# Patient Record
Sex: Female | Born: 1945 | Race: Black or African American | Hispanic: No | Marital: Single | Smoking: Former smoker
Health system: Southern US, Community
[De-identification: ages and names within clinical notes are randomized; demographics above are authoritative.]

## PROBLEM LIST (undated history)

## (undated) DIAGNOSIS — M5136 Other intervertebral disc degeneration, lumbar region: Secondary | ICD-10-CM

## (undated) DIAGNOSIS — K5792 Diverticulitis of intestine, part unspecified, without perforation or abscess without bleeding: Secondary | ICD-10-CM

## (undated) DIAGNOSIS — E119 Type 2 diabetes mellitus without complications: Secondary | ICD-10-CM

## (undated) DIAGNOSIS — E785 Hyperlipidemia, unspecified: Secondary | ICD-10-CM

## (undated) DIAGNOSIS — N189 Chronic kidney disease, unspecified: Secondary | ICD-10-CM

## (undated) DIAGNOSIS — I10 Essential (primary) hypertension: Secondary | ICD-10-CM

## (undated) DIAGNOSIS — M51369 Other intervertebral disc degeneration, lumbar region without mention of lumbar back pain or lower extremity pain: Secondary | ICD-10-CM

## (undated) DIAGNOSIS — K219 Gastro-esophageal reflux disease without esophagitis: Secondary | ICD-10-CM

## (undated) DIAGNOSIS — N390 Urinary tract infection, site not specified: Secondary | ICD-10-CM

## (undated) HISTORY — PX: OTHER SURGICAL HISTORY: SHX169

## (undated) HISTORY — PX: DILATION AND CURETTAGE OF UTERUS: SHX78

## (undated) HISTORY — PX: BACK SURGERY: SHX140

## (undated) HISTORY — PX: LUMBAR FUSION: SHX111

## (undated) HISTORY — PX: SPINAL FUSION: SHX223

## (undated) HISTORY — PX: TUBAL LIGATION: SHX77

---

## 1972-08-14 HISTORY — PX: BREAST CYST ASPIRATION: SHX578

## 2007-08-14 ENCOUNTER — Ambulatory Visit: Payer: Self-pay | Admitting: Obstetrics & Gynecology

## 2007-08-20 ENCOUNTER — Ambulatory Visit: Payer: Self-pay | Admitting: Obstetrics & Gynecology

## 2008-03-18 ENCOUNTER — Ambulatory Visit: Payer: Self-pay | Admitting: Internal Medicine

## 2008-04-15 ENCOUNTER — Ambulatory Visit: Payer: Self-pay | Admitting: Internal Medicine

## 2008-04-16 ENCOUNTER — Ambulatory Visit: Payer: Self-pay | Admitting: Internal Medicine

## 2008-12-19 IMAGING — RF DG UGI W/O KUB
1 series · 15 of 15 positions shown · non-contrast
Comparison: none

REASON FOR EXAM: reflux chest back pain
COMMENTS:

[Series 1: run · 15 of 15 slices shown]
[im 1/15]
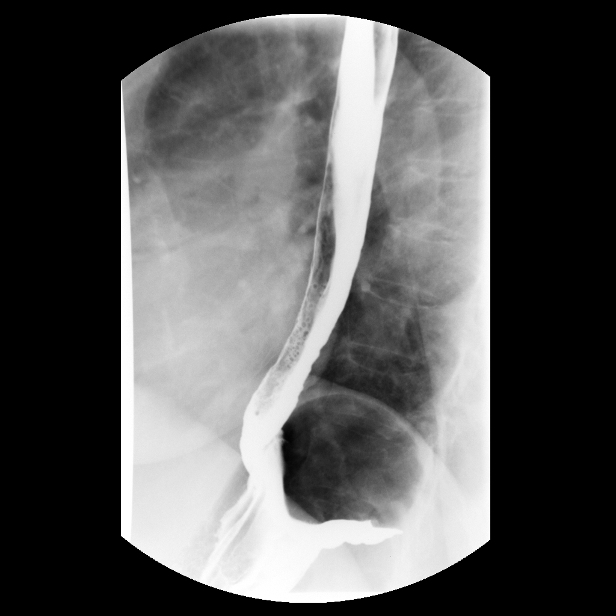
[im 2/15]
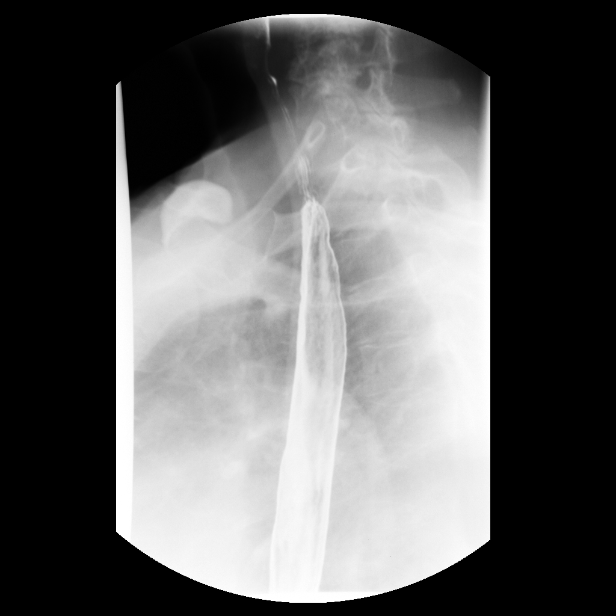
[im 3/15]
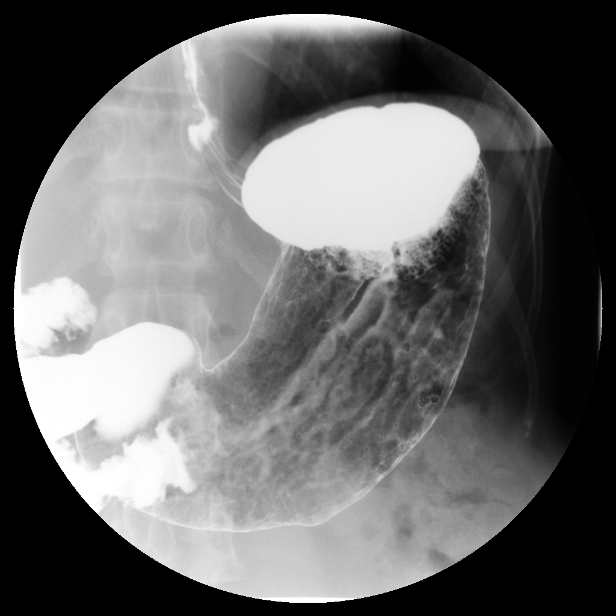
[im 4/15]
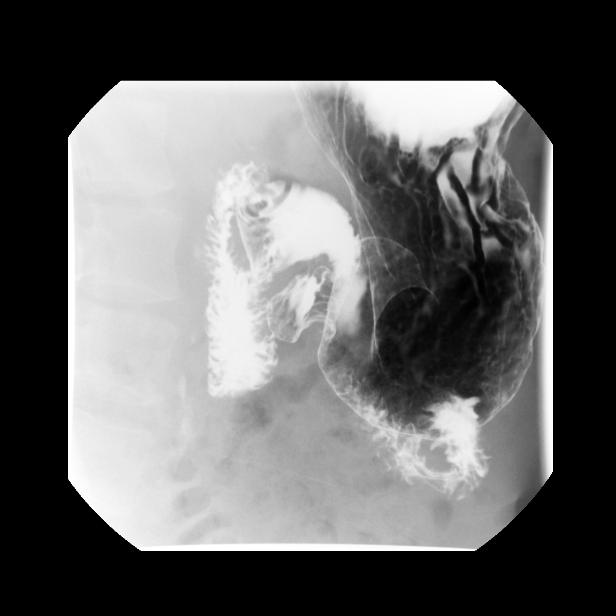
[im 5/15]
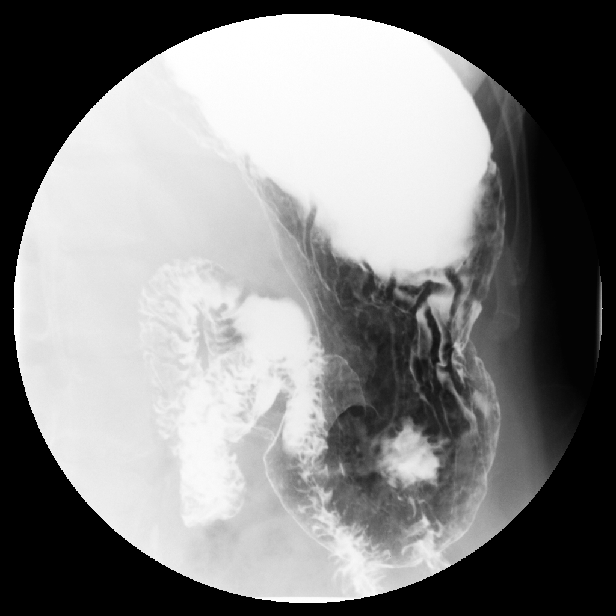
[im 6/15]
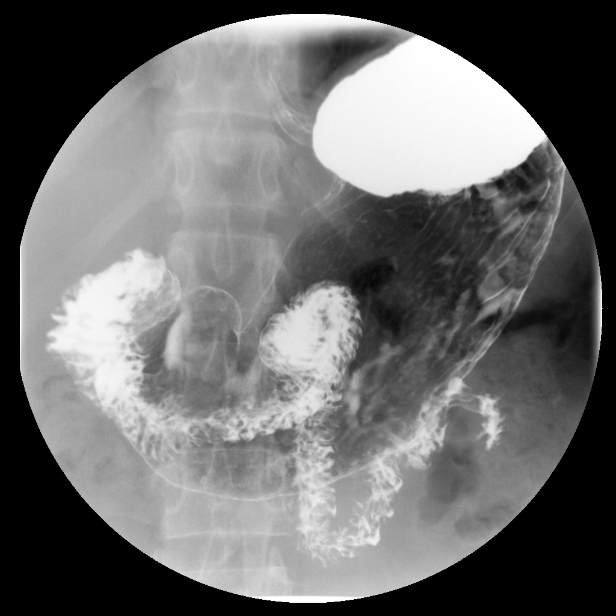
[im 7/15]
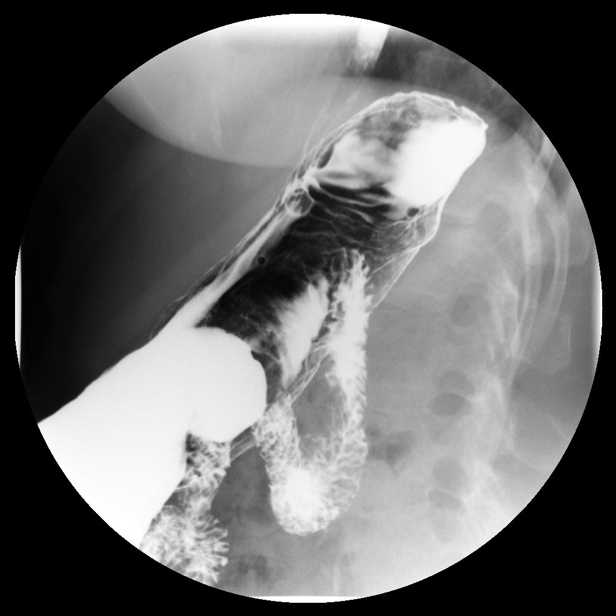
[im 8/15]
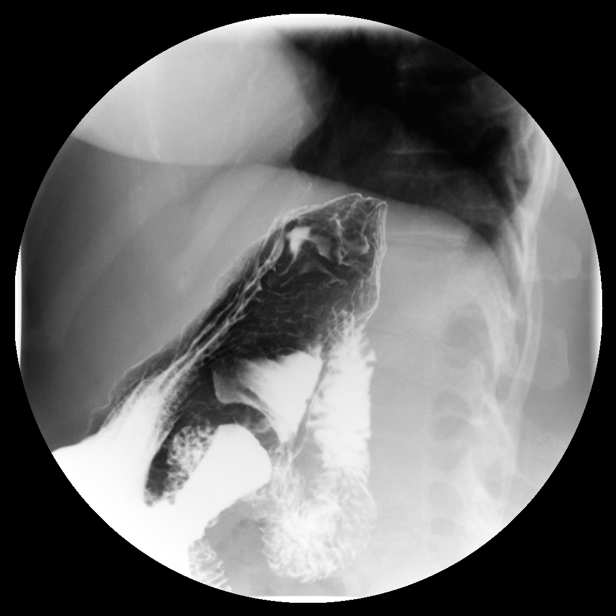
[im 9/15]
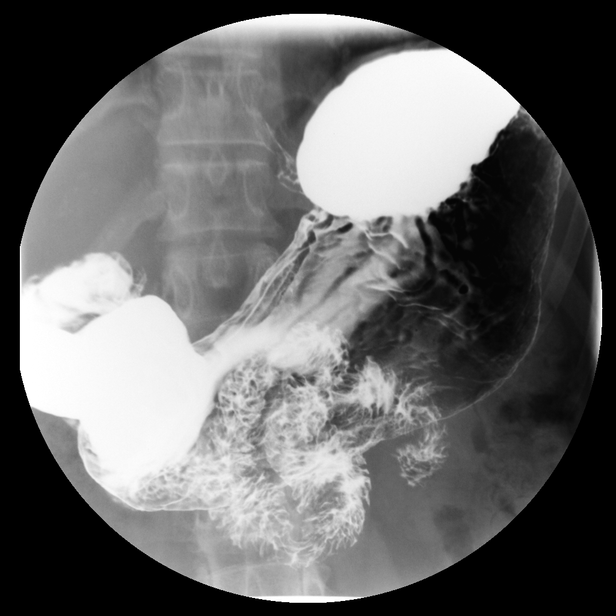
[im 10/15]
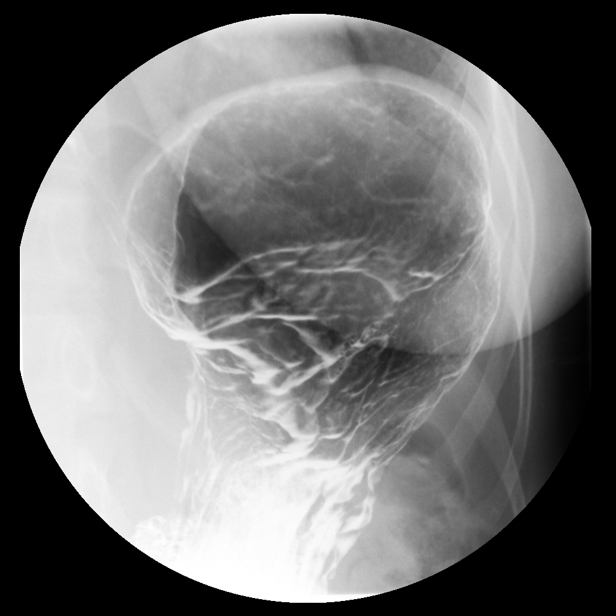
[im 11/15]
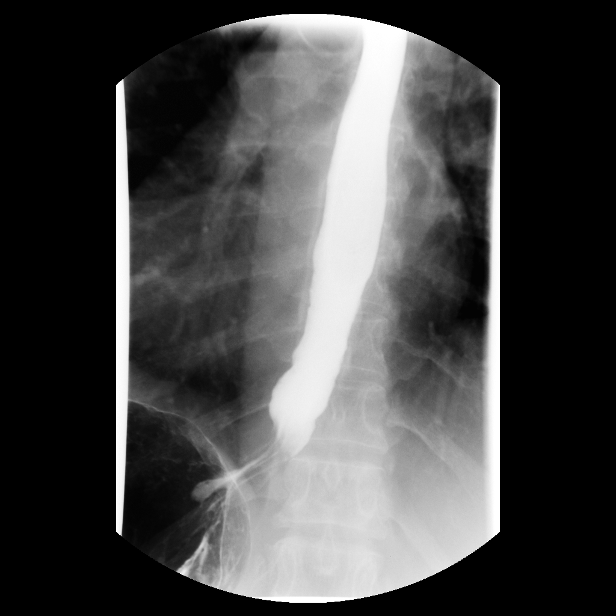
[im 12/15]
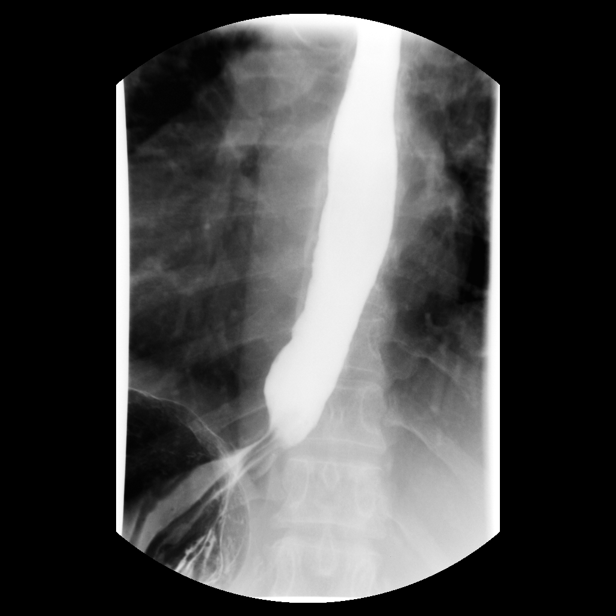
[im 13/15]
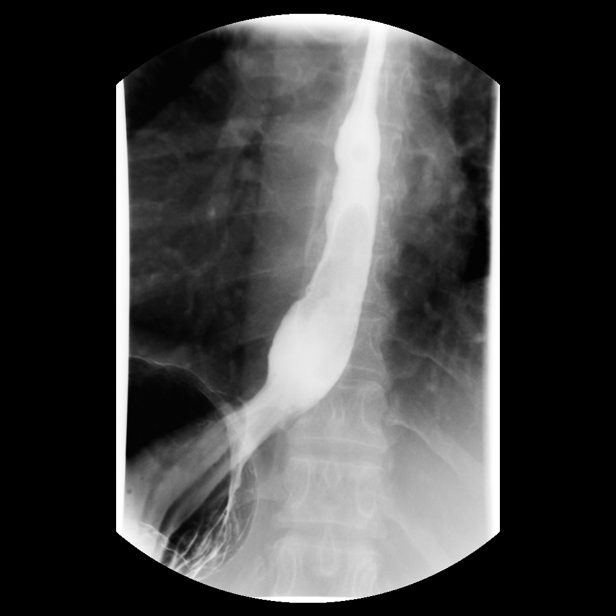
[im 14/15]
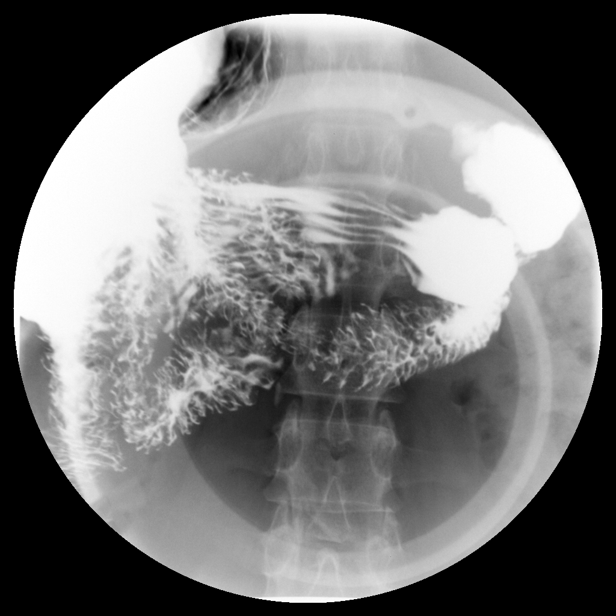
[im 15/15]
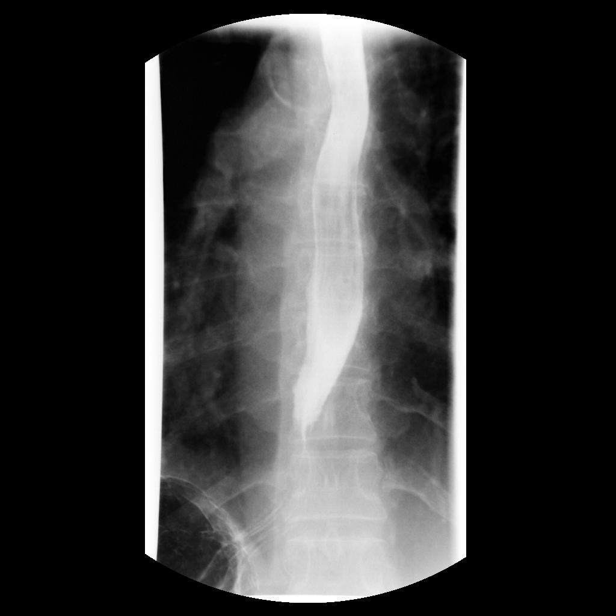

[15 of 15 positions shown; findings below may reference images not displayed]

PROCEDURE:     FL  - FL UPPER GI  - April 16, 2008  [DATE]

RESULT:     The anticipated procedure was discussed with Ms. Una. She
voiced her willingness to proceed. The patient ingested barium without
difficulty. The thoracic esophagus demonstrated normal mucosa and normal
motility. There is a small reducible hiatal hernia. There is a mild to
moderate amount of spontaneous gastroesophageal reflux. The stomach
distended well. The mucosal fold pattern was normal. Gastric emptying was
also normal. The duodenal bulb and C-sweep were normal in appearance.
IMPRESSION: 1. I see no findings suspicious for peptic ulcer disease.
2. There is a moderate amount of gastroesophageal reflux with a small
reducible hiatal hernia. The 12 mm barium pill passed without difficulty.

## 2010-05-12 ENCOUNTER — Emergency Department: Payer: Self-pay | Admitting: Emergency Medicine

## 2010-05-31 ENCOUNTER — Ambulatory Visit: Payer: Self-pay | Admitting: Family Medicine

## 2011-06-19 DIAGNOSIS — R779 Abnormality of plasma protein, unspecified: Secondary | ICD-10-CM | POA: Diagnosis present

## 2011-10-24 ENCOUNTER — Ambulatory Visit: Payer: Self-pay | Admitting: Family Medicine

## 2013-05-20 ENCOUNTER — Emergency Department: Payer: Self-pay | Admitting: Emergency Medicine

## 2013-06-24 ENCOUNTER — Ambulatory Visit: Payer: Self-pay | Admitting: Family Medicine

## 2013-10-19 ENCOUNTER — Emergency Department: Payer: Self-pay | Admitting: Emergency Medicine

## 2013-10-19 LAB — URINALYSIS, COMPLETE
Bilirubin,UR: NEGATIVE
Blood: NEGATIVE
Ketone: NEGATIVE
Nitrite: NEGATIVE
Ph: 5 (ref 4.5–8.0)
Protein: NEGATIVE
RBC,UR: 1 /HPF (ref 0–5)
Specific Gravity: 1.014 (ref 1.003–1.030)
Squamous Epithelial: 5

## 2013-10-19 LAB — COMPREHENSIVE METABOLIC PANEL
ALBUMIN: 4 g/dL (ref 3.4–5.0)
Alkaline Phosphatase: 62 U/L
Anion Gap: 8 (ref 7–16)
BUN: 12 mg/dL (ref 7–18)
Bilirubin,Total: 0.4 mg/dL (ref 0.2–1.0)
CHLORIDE: 103 mmol/L (ref 98–107)
Calcium, Total: 9.8 mg/dL (ref 8.5–10.1)
Co2: 26 mmol/L (ref 21–32)
Creatinine: 0.72 mg/dL (ref 0.60–1.30)
EGFR (African American): >60
EGFR (Non-African Amer.): >60
Glucose: 144 mg/dL — ABNORMAL HIGH (ref 65–99)
Osmolality: 276 (ref 275–301)
Potassium: 3.9 mmol/L (ref 3.5–5.1)
SGOT(AST): 16 U/L (ref 15–37)
SGPT (ALT): 43 U/L (ref 12–78)
SODIUM: 137 mmol/L (ref 136–145)
Total Protein: 8.2 g/dL (ref 6.4–8.2)

## 2013-10-19 LAB — CBC WITH DIFFERENTIAL/PLATELET
BASOS PCT: 0.2 %
Basophil #: 0 10*3/uL (ref 0.0–0.1)
Eosinophil #: 0.1 10*3/uL (ref 0.0–0.7)
Eosinophil %: 2.3 %
HCT: 39.7 % (ref 35.0–47.0)
HGB: 13.6 g/dL (ref 12.0–16.0)
Lymphocyte #: 1.1 10*3/uL (ref 1.0–3.6)
Lymphocyte %: 32.3 %
MCH: 30.4 pg (ref 26.0–34.0)
MCHC: 34.1 g/dL (ref 32.0–36.0)
MCV: 89 fL (ref 80–100)
MONO ABS: 0.4 x10 3/mm (ref 0.2–0.9)
MONOS PCT: 10.3 %
NEUTROS ABS: 1.9 10*3/uL (ref 1.4–6.5)
Neutrophil %: 54.9 %
Platelet: 280 10*3/uL (ref 150–440)
RBC: 4.46 10*6/uL (ref 3.80–5.20)
RDW: 13.6 % (ref 11.5–14.5)
WBC: 3.4 10*3/uL — ABNORMAL LOW (ref 3.6–11.0)

## 2013-10-19 LAB — WET PREP, GENITAL

## 2013-10-19 LAB — GC/CHLAMYDIA PROBE AMP

## 2013-10-19 LAB — LIPASE, BLOOD: Lipase: 111 U/L (ref 73–393)

## 2015-07-07 ENCOUNTER — Emergency Department (HOSPITAL_COMMUNITY): Payer: Medicare HMO

## 2015-07-07 ENCOUNTER — Encounter (HOSPITAL_COMMUNITY): Payer: Self-pay | Admitting: Emergency Medicine

## 2015-07-07 ENCOUNTER — Emergency Department (HOSPITAL_COMMUNITY)
Admission: EM | Admit: 2015-07-07 | Discharge: 2015-07-07 | Disposition: A | Discharge status: Home / Self Care | Payer: Medicare HMO | Attending: Emergency Medicine | Admitting: Emergency Medicine

## 2015-07-07 ENCOUNTER — Emergency Department (HOSPITAL_COMMUNITY)
Admission: EM | Admit: 2015-07-07 | Discharge: 2015-07-07 | Disposition: A | Discharge status: Home / Self Care | Payer: Self-pay | Attending: Emergency Medicine | Admitting: Emergency Medicine

## 2015-07-07 ENCOUNTER — Emergency Department (HOSPITAL_COMMUNITY): Payer: Self-pay

## 2015-07-07 DIAGNOSIS — Y9389 Activity, other specified: Secondary | ICD-10-CM | POA: Insufficient documentation

## 2015-07-07 DIAGNOSIS — Z79899 Other long term (current) drug therapy: Secondary | ICD-10-CM | POA: Diagnosis not present

## 2015-07-07 DIAGNOSIS — M25562 Pain in left knee: Secondary | ICD-10-CM

## 2015-07-07 DIAGNOSIS — E119 Type 2 diabetes mellitus without complications: Secondary | ICD-10-CM | POA: Diagnosis not present

## 2015-07-07 DIAGNOSIS — I10 Essential (primary) hypertension: Secondary | ICD-10-CM | POA: Diagnosis not present

## 2015-07-07 DIAGNOSIS — Y92002 Bathroom of unspecified non-institutional (private) residence single-family (private) house as the place of occurrence of the external cause: Secondary | ICD-10-CM | POA: Insufficient documentation

## 2015-07-07 DIAGNOSIS — S0993XA Unspecified injury of face, initial encounter: Secondary | ICD-10-CM | POA: Insufficient documentation

## 2015-07-07 DIAGNOSIS — S0990XA Unspecified injury of head, initial encounter: Secondary | ICD-10-CM | POA: Insufficient documentation

## 2015-07-07 DIAGNOSIS — Y998 Other external cause status: Secondary | ICD-10-CM | POA: Insufficient documentation

## 2015-07-07 DIAGNOSIS — R269 Unspecified abnormalities of gait and mobility: Secondary | ICD-10-CM | POA: Insufficient documentation

## 2015-07-07 DIAGNOSIS — Z7984 Long term (current) use of oral hypoglycemic drugs: Secondary | ICD-10-CM | POA: Insufficient documentation

## 2015-07-07 DIAGNOSIS — Z794 Long term (current) use of insulin: Secondary | ICD-10-CM | POA: Insufficient documentation

## 2015-07-07 DIAGNOSIS — S00511A Abrasion of lip, initial encounter: Secondary | ICD-10-CM | POA: Insufficient documentation

## 2015-07-07 DIAGNOSIS — W010XXA Fall on same level from slipping, tripping and stumbling without subsequent striking against object, initial encounter: Secondary | ICD-10-CM | POA: Insufficient documentation

## 2015-07-07 DIAGNOSIS — Z880 Allergy status to penicillin: Secondary | ICD-10-CM | POA: Insufficient documentation

## 2015-07-07 DIAGNOSIS — S8992XA Unspecified injury of left lower leg, initial encounter: Secondary | ICD-10-CM | POA: Insufficient documentation

## 2015-07-07 DIAGNOSIS — Z23 Encounter for immunization: Secondary | ICD-10-CM | POA: Insufficient documentation

## 2015-07-07 DIAGNOSIS — W19XXXA Unspecified fall, initial encounter: Secondary | ICD-10-CM

## 2015-07-07 HISTORY — DX: Essential (primary) hypertension: I10

## 2015-07-07 HISTORY — DX: Type 2 diabetes mellitus without complications: E11.9

## 2015-07-07 LAB — I-STAT CHEM 8, ED
BUN: 14 mg/dL (ref 6–20)
CALCIUM ION: 1.16 mmol/L (ref 1.13–1.30)
CHLORIDE: 102 mmol/L (ref 101–111)
Creatinine, Ser: 0.7 mg/dL (ref 0.44–1.00)
Glucose, Bld: 332 mg/dL — ABNORMAL HIGH (ref 65–99)
HCT: 44 % (ref 36.0–46.0)
Hemoglobin: 15 g/dL (ref 12.0–15.0)
POTASSIUM: 3.9 mmol/L (ref 3.5–5.1)
SODIUM: 138 mmol/L (ref 135–145)
TCO2: 23 mmol/L (ref 0–100)

## 2015-07-07 LAB — CBG MONITORING, ED: GLUCOSE-CAPILLARY: 318 mg/dL — AB (ref 65–99)

## 2015-07-07 MED ORDER — SODIUM CHLORIDE 0.9 % IV BOLUS (SEPSIS)
1000.0000 mL | Freq: Once | INTRAVENOUS | Status: AC
  Administered 2015-07-07: 1000 mL via INTRAVENOUS

## 2015-07-07 MED ORDER — TETANUS-DIPHTH-ACELL PERTUSSIS 5-2.5-18.5 LF-MCG/0.5 IM SUSP
0.5000 mL | Freq: Once | INTRAMUSCULAR | Status: AC
  Administered 2015-07-07: 0.5 mL via INTRAMUSCULAR
  Filled 2015-07-07: qty 0.5

## 2015-07-07 MED ORDER — ACIDOPHILUS PROBIOTIC 10 MG PO TABS: 10.0000 mg | ORAL_TABLET | Freq: Three times a day (TID) | ORAL | Status: DC

## 2015-07-07 MED ORDER — CLINDAMYCIN HCL 150 MG PO CAPS: 300.0000 mg | ORAL_CAPSULE | Freq: Three times a day (TID) | ORAL | Status: DC

## 2015-07-07 MED ORDER — GADOBENATE DIMEGLUMINE 529 MG/ML IV SOLN
12.0000 mL | Freq: Once | INTRAVENOUS | Status: AC | PRN
  Administered 2015-07-07: 12 mL via INTRAVENOUS

## 2015-07-07 NOTE — ED Notes (Signed)
Pt just returned after checking out AMA. See previous notes.

## 2015-07-07 NOTE — ED Notes (Signed)
Pt transported to xray 

## 2015-07-07 NOTE — ED Notes (Signed)
Pt wanting to leave AMA, states she's got to do some shopping before the stores close for the holiday. IV removed, pt signed AMA form.

## 2015-07-07 NOTE — ED Notes (Signed)
Pt from restaurant following a fall where pt tripped and fell in the bathroom from standing position. Pt states she landed on her left knee and hit her face on the floor, pt noted to have wound to upper lip with mild swelling, states her tooth went through her upper lip. Pt denies any loc, denies taking any blood thinners at this time. Pt ambulatory to triage. nad ntoed.

## 2015-07-07 NOTE — Discharge Instructions (Signed)
[name], [name] meeting you! Please follow-up with outpatient neurology. Return to the emergency department if you have increasing difficulty walking, develop confusion, nausea or vomiting.  Feel better soon!  S. Wendie Simmer, PA-C

## 2015-07-07 NOTE — ED Provider Notes (Signed)
69 y.o. Female with mechanical fall, knee pain, and facial laceration.  Knee plain x-Rondal Vandevelde with possible patellar fx with poor clinical correlation.    I performed a history and physical examination of Mia Todd and discussed her management with Mr. Porfirio Mylar.  I agree with the history, physical, assessment, and plan of care, with the following exceptions: None  I was present for the following procedures: None Time Spent in Critical Care of the patient: None Time spent in discussions with the patient and family: 7  Lyndall Windt Shaune Pollack, MD 07/07/15 1540

## 2015-07-07 NOTE — ED Notes (Signed)
Pt A&Ox4, ambulatory at d/c with steady gait, NAD 

## 2015-07-07 NOTE — Discharge Instructions (Signed)
Knee Pain Knee pain is a very common symptom and can have many causes. Knee pain often goes away when you follow your health care provider's instructions for relieving pain and discomfort at home. However, knee pain can develop into a condition that needs treatment. Some conditions may include:  Arthritis caused by wear and tear (osteoarthritis).  Arthritis caused by swelling and irritation (rheumatoid arthritis or gout).  A cyst or growth in your knee.  An infection in your knee joint.  An injury that will not heal.  Damage, swelling, or irritation of the tissues that support your knee (torn ligaments or tendinitis). If your knee pain continues, additional tests may be ordered to diagnose your condition. Tests may include X-rays or other imaging studies of your knee. You may also need to have fluid removed from your knee. Treatment for ongoing knee pain depends on the cause, but treatment may include:  Medicines to relieve pain or swelling.  Steroid injections in your knee.  Physical therapy.  Surgery. HOME CARE INSTRUCTIONS  Take medicines only as directed by your health care provider.  Rest your knee and keep it raised (elevated) while you are resting.  Do not do things that cause or worsen pain.  Avoid high-impact activities or exercises, such as running, jumping rope, or doing jumping jacks.  Apply ice to the knee area:  Put ice in a plastic bag.  Place a towel between your skin and the bag.  Leave the ice on for 20 minutes, 2-3 times a day.  Ask your health care provider if you should wear an elastic knee support.  Keep a pillow under your knee when you sleep.  Lose weight if you are overweight. Extra weight can put pressure on your knee.  Do not use any tobacco products, including cigarettes, chewing tobacco, or electronic cigarettes. If you need help quitting, ask your health care provider. Smoking may slow the healing of any bone and joint problems that you may  have. SEEK MEDICAL CARE IF:  Your knee pain continues, changes, or gets worse.  You have a fever along with knee pain.  Your knee buckles or locks up.  Your knee becomes more swollen. SEEK IMMEDIATE MEDICAL CARE IF:   Your knee joint feels hot to the touch.  You have chest pain or trouble breathing.   This information is not intended to replace advice given to you by your health care provider. Make sure you discuss any questions you have with your health care provider.   Document Released: 05/28/2007 Document Revised: 08/21/2014 Document Reviewed: 03/16/2014 Elsevier Interactive Patient Education 2016 [place]. Mouth Laceration A mouth laceration is a deep cut in the lining of your mouth (mucosa). The laceration may extend into your lip or go all of the way through your mouth and cheek. Lacerations inside your mouth may involve your tongue, the insides of your cheeks, or the upper surface of your mouth (palate). Mouth lacerations may bleed a lot because your mouth has a very rich blood supply. Mouth lacerations may need to be repaired with stitches (sutures). CAUSES Any type of facial injury can cause a mouth laceration. Common causes include:  Getting hit in the mouth.  Being in a car accident. SYMPTOMS The most common sign of a mouth laceration is bleeding that fills the mouth. DIAGNOSIS Your health care provider can diagnose a mouth laceration by examining your mouth. Your mouth may need to be washed out (irrigated) with a sterile salt-water (saline) solution. Your health care  provider may also have to remove any blood clots to determine how bad your injury is. You may need X-rays of the bones in your jaw or your face to rule out other injuries, such as dental injuries, facial fractures, or jaw fractures. TREATMENT Treatment depends on the location and severity of your injury. Small mouth lacerations may not need treatment if bleeding has stopped. You may need sutures  if:  You have a tongue laceration.  Your mouth laceration is large or deep, or it continues to bleed. If sutures are necessary, your health care provider will use absorbable sutures that dissolve as your body heals. You may also receive antibiotic medicine or a tetanus shot. HOME CARE INSTRUCTIONS  Take medicines only as directed by your health care provider.  If you were prescribed an antibiotic medicine, finish all of it even if you start to feel better.  Eat as directed by your health care provider. You may only be able to drink liquids or eat soft foods for a few days.  Rinse your mouth with a warm, salt-water rinse 4-6 times per day or as directed by your health care provider. You can make a salt-water rinse by mixing one tsp of salt into two cups of warm water.  Do not poke the sutures with your tongue. Doing that can loosen them.  Check your wound every day for signs of infection. It is normal to have a white or gray patch over your wound while it heals. Watch for:  Redness.  Swelling.  Blood or pus.  Maintain regular oral hygiene, if possible. Gently brush your teeth with a soft, nylon-bristled toothbrush 2 times per day.  Keep all follow-up visits as directed by your health care provider. This is important. SEEK MEDICAL CARE IF:  You were given a tetanus shot and have swelling, severe pain, redness, or bleeding at the injection site.  You have a fever.  Your pain is not controlled with medicine.  You have redness, swelling, or pain at your wound that is getting worse.  You have fresh bleeding or pus coming from your wound.  The edges of your wound break open.  You develop swollen, tender glands in your throat. SEEK IMMEDIATE MEDICAL CARE IF:   Your face or the area under your jaw becomes swollen.  You have trouble breathing or swallowing.   This information is not intended to replace advice given to you by your health care provider. Make sure you discuss any  questions you have with your health care provider.   Document Released: 07/31/2005 Document Revised: 12/15/2014 Document Reviewed: 07/22/2014 Elsevier Interactive Patient Education [employer].

## 2015-07-07 NOTE — ED Provider Notes (Signed)
CSN: [id number]     Arrival date & time 07/07/15  1303 History   First MD Initiated Contact with Patient 07/07/15 1404     Chief Complaint  Patient presents with  . Fall  . Facial Injury  . Knee Pain   Mia Todd is a 69 y.o. female with history of hypertension and diabetes who presents to the emergency department after a trip and fall today.  The patient reports she was walking out of the bathroom when she tripped and fell onto her left knee then onto her mouth. She denies hitting her head or loss of consciousness. She complains of 6 out of 10 pain to her left knee. She denies previous knee injury. She reports feeling slightly lightheaded after the fall. The patient is not on anticoagulants. The patient denies fevers, chills, chest pain, shortness of breath, abdominal pain, nausea, vomiting, numbness, tingling, weakness, double vision, neck pain, back pain, also bladder control, loss of bowel control or rashes.  (Consider location/radiation/quality/duration/timing/severity/associated sxs/prior Treatment) HPI  Past Medical History  Diagnosis Date  . Hypertension   . Diabetes mellitus without complication [facility])    Past Surgical History  Procedure Laterality Date  . Spinal fusion     No family history on file. Social History  Substance Use Topics  . Smoking status: Never Smoker   . Smokeless tobacco: None  . Alcohol Use: Yes     Comment: rarely   OB History    No data available     Review of Systems  Constitutional: Negative for fever and chills.  HENT: Negative for congestion, ear discharge, ear pain, hearing loss, nosebleeds, sore throat and trouble swallowing.   Eyes: Negative for visual disturbance.  Respiratory: Negative for cough, shortness of breath and wheezing.   Cardiovascular: Negative for chest pain and palpitations.  Gastrointestinal: Negative for nausea, vomiting, abdominal pain and diarrhea.  Genitourinary: Negative for dysuria and difficulty urinating.   Musculoskeletal: Negative for back pain, gait problem and neck pain.  Skin: Positive for wound. Negative for rash.  Neurological: Positive for light-headedness. Negative for dizziness, seizures, syncope, speech difficulty, weakness, numbness and headaches.      Allergies  Doxycycline; Levaquin; Bactrim; and Penicillins  Home Medications   Prior to Admission medications   Medication Sig Start Date End Date Taking? Authorizing Provider  amLODipine (NORVASC) 10 MG tablet Take 1 tablet by mouth daily. 02/12/14  Yes Historical Provider, MD  benazepril (LOTENSIN) 40 MG tablet Take 1 tablet by mouth daily. 12/04/14  Yes Historical Provider, MD  conjugated estrogens (PREMARIN) vaginal cream Place vaginally. 12/07/14 12/07/15 Yes Historical Provider, MD  glimepiride (AMARYL) 4 MG tablet Take 1 tablet by mouth 2 (two) times daily. 02/12/14  Yes Historical Provider, MD  Insulin Detemir (LEVEMIR FLEXPEN) 100 UNIT/ML Pen Inject 7 Units into the skin daily. 02/19/14  Yes Historical Provider, MD  metFORMIN (GLUCOPHAGE) 1000 MG tablet Take 1 tablet by mouth daily. 02/12/14  Yes Historical Provider, MD  Multiple Vitamin (MULTIVITAMIN) capsule Take 1 capsule by mouth daily.   Yes Historical Provider, MD  Omega-3 Fatty Acids (SUPER TWIN EPA/DHA) 1250 MG CAPS Take 2 capsules by mouth daily. 05/18/09  Yes Historical Provider, MD  clindamycin (CLEOCIN) 150 MG capsule Take 2 capsules (300 mg total) by mouth 3 (three) times daily. May dispense as 150mg  capsules 07/07/15   Waynetta Pean, PA-C  Lactobacillus (ACIDOPHILUS PROBIOTIC) 10 MG TABS Take 10 mg by mouth 3 (three) times daily. 07/07/15   Waynetta Pean, PA-C   BP  126/63 mmHg  Pulse 72  Temp(Src) 98.2 F (36.8 C) (Oral)  Resp 14  Ht 5\' 5"  (1.651 m)  Wt 56.7 kg  BMI 20.80 kg/m2  SpO2 100% Physical Exam  Constitutional: She is oriented to person, place, and time. She appears well-developed and well-nourished. No distress.  Nontoxic appearing.  HENT:  Head:  Normocephalic and atraumatic.  Mouth/Throat: Oropharynx is clear and moist.  The patient has a 1 mm small abrasion to her upper lip. This is not bleeding. No dental fractures noted. She has a small half a centimeter abrasion to her upper inner lip. No full thickness laceration. No lacerations. No bleeding in the mouth. No dental fractures noted.  Bilateral tympanic membranes are pearly-gray without erythema or loss of landmarks.   Eyes: Conjunctivae and EOM are normal. Pupils are equal, round, and reactive to light. Right eye exhibits no discharge. Left eye exhibits no discharge.  Neck: Normal range of motion. Neck supple. No JVD present. No tracheal deviation present.  Cardiovascular: Normal rate, regular rhythm, normal heart sounds and intact distal pulses.  Exam reveals no gallop and no friction rub.   No murmur heard. Bilateral radial, posterior tibialis and dorsalis pedis pulses are intact.  Good capillary refill to her left toes.  Pulmonary/Chest: Effort normal and breath sounds normal. No respiratory distress. She has no wheezes. She has no rales. She exhibits no tenderness.  Lungs are clear to auscultation bilaterally.  Abdominal: Soft. There is no tenderness. There is no guarding.  Musculoskeletal: Normal range of motion. She exhibits no edema or tenderness.  No lower extremity edema or tenderness. Good range of motion of her left knee. She is able to ambulate with normal gait. No left knee edema, deformity, ecchymosis or erythema. No left knee laxity noted. Patient has no tenderness to her patella.  Lymphadenopathy:    She has no cervical adenopathy.  Neurological: She is alert and oriented to person, place, and time. Coordination normal.  The patient is alert and oriented3. Cranial nerves are intact. She is able to ambulate with steady in normal gait. Sensation is intact to her bilateral upper and lower extremities.  Skin: Skin is warm and dry. No rash noted. She is not diaphoretic. No  erythema. No pallor.  Psychiatric: She has a normal mood and affect. Her behavior is normal.  Nursing note and vitals reviewed.   ED Course  Procedures (including critical care time) Labs Review Labs Reviewed  CBG MONITORING, ED - Abnormal; Notable for the following:    Glucose-Capillary 318 (*)    All other components within normal limits  I-STAT CHEM 8, ED - Abnormal; Notable for the following:    Glucose, Bld 332 (*)    All other components within normal limits    Imaging Review Dg Knee Complete 4 Views Left  07/07/2015  CLINICAL DATA:  Left knee pain with limited weight-bearing after falling today. Initial encounter. EXAM: LEFT KNEE - COMPLETE 4+ VIEW COMPARISON:  None. FINDINGS: The mineralization and alignment are normal. There is fragmentation of the lower pole of the patella. This area is not well visualized on the AP and oblique views due to overlap with the femur. Its margins are fairly well corticated on the lateral view, an it is possible that this is not an acute finding. However, if there is pain anteriorly, this must be presumed to reflect a nondisplaced patellar fracture. No other acute osseous findings demonstrated. There is no significant joint effusion. IMPRESSION: Fragmentation of the lower pole of  the patella with indeterminate appearance. Nondisplaced acute fracture cannot be excluded. Correlate with area of pain. Electronically Signed   By: Richardean Sale M.D.   On: 07/07/2015 14:51   I have personally reviewed and evaluated these images and lab results as part of my medical decision-making.   EKG Interpretation None      Filed Vitals:   07/07/15 1515 07/07/15 1600 07/07/15 1615 07/07/15 1638  BP: 125/69 146/72 136/69 126/63  Pulse: 70 69 68 72  Temp:      TempSrc:      Resp:    14  Height:      Weight:      SpO2: 100% 100% 100% 100%     MDM   Meds given in ED:  Medications  Tdap (BOOSTRIX) injection 0.5 mL (0.5 mLs Intramuscular Given 07/07/15  1430)  sodium chloride 0.9 % bolus 1,000 mL (1,000 mLs Intravenous New Bag/Given 07/07/15 1550)    New Prescriptions   CLINDAMYCIN (CLEOCIN) 150 MG CAPSULE    Take 2 capsules (300 mg total) by mouth 3 (three) times daily. May dispense as 150mg  capsules   LACTOBACILLUS (ACIDOPHILUS PROBIOTIC) 10 MG TABS    Take 10 mg by mouth 3 (three) times daily.    Final diagnoses:  Fall, initial encounter  Left knee pain   This is a 69 y.o. female with history of hypertension and diabetes who presents to the emergency department after a trip and fall today.  The patient reports she was walking out of the bathroom when she tripped and fell onto her left knee then onto her mouth. She denies hitting her head or loss of consciousness. She complains of 6 out of 10 pain to her left knee. On exam patient is afebrile nontoxic appearing. She has no focal neurological deficits. She has no tenderness to her patella. She does have mild tenderness to her anterior tibia. Patient has no fractures to her mouth. She has a small abrasion to her inner upper lip. Will provide with clindamycin and probiotic for this mouth abrasion.   Left knee x-ray shows fragmentation of the lower pole of the patella with indeterminate appearance. Nondisplaced acute fracture cannot be excluded. On exam patient is nontender to her anterior patella. I doubt fracture. He is able to ambulate on her knee without difficulty. We'll have her follow-up with orthopedic surgery. The patient's Chem-8 revealed a glucose of 332. Patient reports she ate a large meal which included french fries prior to arrival. Patient has an anion gap of 13. Will provide with fluid bolus. At reevaluation patient reports she is feeling much better. Nursing staff reports that when she ambulated to the bathroom she was initially unsteady on her feet. Patient admits to feeling slightly off balance when ambulating when she initially started up today. Will obtain head CT due to this.   At shift change patient is awaiting head CT. Patient care handed off to N. Ovid Curd, PA-C at shift change who will follow-up on the head CT and disposition accordingly.   This patient was discussed with Dr. Jeanell Sparrow who agrees with assessment and plan.     Waynetta Pean, PA-C 07/07/15 1658  Pattricia Boss, MD 07/08/15 [phone]

## 2015-07-22 NOTE — ED Provider Notes (Signed)
Physical Exam  BP 154/72 mmHg  Pulse 65  Temp(Src) 97.6 F (36.4 C) (Oral)  Resp 18  SpO2 100%  Physical Exam  Constitutional: She is oriented to person, place, and time. She appears well-developed and well-nourished. No distress.  HENT:  Head: Normocephalic and atraumatic.  Right Ear: External ear normal.  Left Ear: External ear normal.  Nose: Nose normal.  Mouth/Throat: Oropharynx is clear and moist. No oropharyngeal exudate.  Eyes: Conjunctivae and EOM are normal. Pupils are equal, round, and reactive to light. Right eye exhibits no discharge. Left eye exhibits no discharge. No scleral icterus.  Neck: Normal range of motion. Neck supple. No tracheal deviation present.  Cardiovascular: Normal rate, regular rhythm, normal heart sounds and intact distal pulses.  Exam reveals no gallop and no friction rub.   No murmur heard. Pulmonary/Chest: Effort normal and breath sounds normal. No respiratory distress. She has no wheezes. She has no rales. She exhibits no tenderness.  Abdominal: Soft. Bowel sounds are normal. She exhibits no distension and no mass. There is no tenderness. There is no rebound and no guarding.  Musculoskeletal: Normal range of motion. She exhibits no edema.  Lymphadenopathy:    She has no cervical adenopathy.  Neurological: She is alert and oriented to person, place, and time. She has normal reflexes. No cranial nerve deficit. Coordination normal.  Positive pronator drift, Romberg, right heel to left shin  Skin: Skin is warm and dry. No rash noted. She is not diaphoretic. No erythema.  Psychiatric: She has a normal mood and affect. Her behavior is normal.  Nursing note and vitals reviewed.   ED Course  Procedures  MDM Patient hand-off received from Will Dansie, PA-C at shift change. Patient pending head CT for unsteady gait (although normal upon initial evaluation by Sula Rumple, CNA and RN informed him the patient was unsteady ambulating to restroom). Per Will,  if head CT normal then patient may be discharged.   Head CT results are as follows:  Ct Head Wo Contrast  07/07/2015  CLINICAL DATA:  Dizzy after fall ; nausea EXAM: CT HEAD WITHOUT CONTRAST TECHNIQUE: Contiguous axial images were obtained from the base of the skull through the vertex without intravenous contrast. COMPARISON:  None. FINDINGS: The ventricles are normal in size and configuration. There is a cystic structure in the superior posterior left occipital lobe measuring 2.5 x 1.9 cm. This lesion does not have an appearance typical of infarct. No other masslike area is seen. There is no appreciable edema. There is no hemorrhage, extra-axial fluid collection, or midline shift. There is slight small vessel disease in the centra semiovale bilaterally. Gray-white compartments elsewhere appear normal. Bony calvarium appears intact. The mastoid air cells are clear. Visualized orbits appear normal. IMPRESSION: Cystic appearing structure in the superior posterior left occipital lobe. No surrounding edema. A low-grade neoplasm must be of concern given this appearance. This finding warrants pre and post-contrast brain MR to further evaluate. Study otherwise unremarkable except for minimal periventricular small vessel disease. No hemorrhage or extra-axial fluid seen. Electronically Signed   By: Lowella Grip III M.D.   On: 07/07/2015 18:10   Went to go inform patient of results but RN informed me patient had left AMA to go shopping. Called patient, who was still in parking lot, and told her the CT results. She checked back in. Radiology results advised MRI follow-up. Results below:  Mr Jeri Cos [id number] Contrast  07/07/2015  CLINICAL DATA:  Trip and fall while walking out of  the bathroom today. Mild lightheadedness after the fall. Cystic lesion on CT. EXAM: MRI HEAD WITHOUT AND WITH CONTRAST TECHNIQUE: Multiplanar, multiecho pulse sequences of the brain and surrounding structures were obtained without and with  intravenous contrast. CONTRAST:  12 mL MultiHance COMPARISON:  Head CT 07/07/2015 FINDINGS: There is no evidence of acute infarct, intracranial hemorrhage, midline shift, or extra-axial fluid collection. Ventricles and sulci are normal for age. Punctate foci of T2 hyperintensity in the subcortical and deep cerebral white matter bilaterally are nonspecific but compatible with minimal chronic small vessel ischemic disease. As seen on head CT earlier today, there is a mildly lobulated cystic lesion centered in the left occipital white matter which measures 2.4 x 1.7 x 2.3 cm. There are one or two thin internal septations. The cyst follows CSF signal intensity on all pulse sequences, and there is no evidence of a solid or enhancing component. There is no surrounding edema. No abnormal enhancement is seen elsewhere. Orbits are unremarkable. A small left maxillary sinus mucous retention cyst is noted. Mastoid air cells are clear. Major intracranial vascular flow voids are preserved. IMPRESSION: 1. No acute intracranial abnormality. 2. 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 This may be developmental, such as a neuroglial cyst although they are typically unilocular. Consider follow-up brain MRI in 12 months to ensure stability. Electronically Signed   By: Logan Bores M.D.   On: 07/07/2015 20:40   Performed neuro exam on patient, which was remarkable for positive Romberg and pronator drift, positive right heel to shin. Dr. Alfonse Spruce consulted neuro, who explained the results from the MRI would not cause her acute changes and advised OP follow-up. Explained MRI results to patient. Patient may be safely discharged home. Discussed reasons for return. Patient to follow-up with neurology. Patient in understanding and agreement with the plan.    [place], PA-C 07/22/15 1717  Harvel Quale, MD 07/30/15 [phone]

## 2016-09-19 ENCOUNTER — Ambulatory Visit
Admission: RE | Admit: 2016-09-19 | Discharge: 2016-09-19 | Disposition: A | Discharge status: Home / Self Care | Payer: Medicare HMO | Source: Ambulatory Visit | Attending: Family Medicine | Admitting: Family Medicine

## 2016-09-19 ENCOUNTER — Other Ambulatory Visit: Payer: Self-pay | Admitting: Family Medicine

## 2016-09-19 DIAGNOSIS — Z1231 Encounter for screening mammogram for malignant neoplasm of breast: Secondary | ICD-10-CM | POA: Diagnosis present

## 2017-10-29 ENCOUNTER — Other Ambulatory Visit: Payer: Self-pay | Admitting: Family Medicine

## 2017-10-29 DIAGNOSIS — Z1231 Encounter for screening mammogram for malignant neoplasm of breast: Secondary | ICD-10-CM

## 2017-10-30 ENCOUNTER — Encounter (INDEPENDENT_AMBULATORY_CARE_PROVIDER_SITE_OTHER): Payer: Self-pay

## 2017-10-30 ENCOUNTER — Ambulatory Visit
Admission: RE | Admit: 2017-10-30 | Discharge: 2017-10-30 | Disposition: A | Discharge status: Home / Self Care | Payer: Medicare HMO | Source: Ambulatory Visit | Attending: Family Medicine | Admitting: Family Medicine

## 2017-10-30 DIAGNOSIS — Z1231 Encounter for screening mammogram for malignant neoplasm of breast: Secondary | ICD-10-CM | POA: Diagnosis present

## 2018-03-04 ENCOUNTER — Encounter: Payer: Self-pay | Admitting: *Deleted

## 2018-03-05 ENCOUNTER — Ambulatory Visit: Payer: Medicare HMO | Admitting: Anesthesiology

## 2018-03-05 ENCOUNTER — Encounter: Payer: Self-pay | Admitting: *Deleted

## 2018-03-05 ENCOUNTER — Encounter
Admission: RE | Disposition: A | Discharge status: Home / Self Care | Payer: Self-pay | Source: Ambulatory Visit | Attending: Gastroenterology

## 2018-03-05 ENCOUNTER — Other Ambulatory Visit: Payer: Self-pay

## 2018-03-05 ENCOUNTER — Ambulatory Visit
Admission: RE | Admit: 2018-03-05 | Discharge: 2018-03-05 | Disposition: A | Discharge status: Home / Self Care | Payer: Medicare HMO | Source: Ambulatory Visit | Attending: Gastroenterology | Admitting: Gastroenterology

## 2018-03-05 DIAGNOSIS — E785 Hyperlipidemia, unspecified: Secondary | ICD-10-CM | POA: Insufficient documentation

## 2018-03-05 DIAGNOSIS — Z794 Long term (current) use of insulin: Secondary | ICD-10-CM | POA: Insufficient documentation

## 2018-03-05 DIAGNOSIS — N189 Chronic kidney disease, unspecified: Secondary | ICD-10-CM | POA: Insufficient documentation

## 2018-03-05 DIAGNOSIS — D124 Benign neoplasm of descending colon: Secondary | ICD-10-CM | POA: Insufficient documentation

## 2018-03-05 DIAGNOSIS — I129 Hypertensive chronic kidney disease with stage 1 through stage 4 chronic kidney disease, or unspecified chronic kidney disease: Secondary | ICD-10-CM | POA: Insufficient documentation

## 2018-03-05 DIAGNOSIS — K219 Gastro-esophageal reflux disease without esophagitis: Secondary | ICD-10-CM | POA: Insufficient documentation

## 2018-03-05 DIAGNOSIS — E1322 Other specified diabetes mellitus with diabetic chronic kidney disease: Secondary | ICD-10-CM | POA: Diagnosis not present

## 2018-03-05 DIAGNOSIS — Z79899 Other long term (current) drug therapy: Secondary | ICD-10-CM | POA: Insufficient documentation

## 2018-03-05 DIAGNOSIS — K5730 Diverticulosis of large intestine without perforation or abscess without bleeding: Secondary | ICD-10-CM | POA: Diagnosis not present

## 2018-03-05 DIAGNOSIS — Z1211 Encounter for screening for malignant neoplasm of colon: Secondary | ICD-10-CM | POA: Insufficient documentation

## 2018-03-05 HISTORY — DX: Other intervertebral disc degeneration, lumbar region without mention of lumbar back pain or lower extremity pain: M51.369

## 2018-03-05 HISTORY — DX: Hyperlipidemia, unspecified: E78.5

## 2018-03-05 HISTORY — DX: Diverticulitis of intestine, part unspecified, without perforation or abscess without bleeding: K57.92

## 2018-03-05 HISTORY — DX: Other intervertebral disc degeneration, lumbar region: M51.36

## 2018-03-05 HISTORY — DX: Chronic kidney disease, unspecified: N18.9

## 2018-03-05 HISTORY — DX: Gastro-esophageal reflux disease without esophagitis: K21.9

## 2018-03-05 HISTORY — DX: Urinary tract infection, site not specified: N39.0

## 2018-03-05 HISTORY — PX: COLONOSCOPY WITH PROPOFOL: SHX5780

## 2018-03-05 LAB — GLUCOSE, CAPILLARY
GLUCOSE-CAPILLARY: 143 mg/dL — AB (ref 70–99)
Glucose-Capillary: 158 mg/dL — ABNORMAL HIGH (ref 70–99)

## 2018-03-05 SURGERY — COLONOSCOPY WITH PROPOFOL
Anesthesia: General

## 2018-03-05 MED ORDER — PROPOFOL 10 MG/ML IV BOLUS
INTRAVENOUS | Status: DC | PRN
  Administered 2018-03-05 (×2): 50 mg via INTRAVENOUS

## 2018-03-05 MED ORDER — PHENYLEPHRINE HCL 10 MG/ML IJ SOLN
INTRAMUSCULAR | Status: AC
  Filled 2018-03-05: qty 1

## 2018-03-05 MED ORDER — FENTANYL CITRATE (PF) 100 MCG/2ML IJ SOLN
INTRAMUSCULAR | Status: AC
  Filled 2018-03-05: qty 2

## 2018-03-05 MED ORDER — SODIUM CHLORIDE 0.9 % IV SOLN
INTRAVENOUS | Status: DC
  Administered 2018-03-05: 08:00:00 via INTRAVENOUS

## 2018-03-05 MED ORDER — EPHEDRINE SULFATE 50 MG/ML IJ SOLN
INTRAMUSCULAR | Status: AC
  Filled 2018-03-05: qty 1

## 2018-03-05 MED ORDER — MIDAZOLAM HCL 2 MG/2ML IJ SOLN
INTRAMUSCULAR | Status: AC
  Filled 2018-03-05: qty 2

## 2018-03-05 MED ORDER — DEXMEDETOMIDINE HCL 200 MCG/2ML IV SOLN
INTRAVENOUS | Status: DC | PRN
  Administered 2018-03-05: 8 ug via INTRAVENOUS

## 2018-03-05 MED ORDER — PROPOFOL 500 MG/50ML IV EMUL
INTRAVENOUS | Status: DC | PRN
  Administered 2018-03-05: 125 ug/kg/min via INTRAVENOUS

## 2018-03-05 MED ORDER — PROPOFOL 10 MG/ML IV BOLUS
INTRAVENOUS | Status: AC
  Filled 2018-03-05: qty 20

## 2018-03-05 MED ORDER — LIDOCAINE HCL (PF) 1 % IJ SOLN
INTRAMUSCULAR | Status: AC
  Filled 2018-03-05: qty 2

## 2018-03-05 MED ORDER — PROPOFOL 500 MG/50ML IV EMUL
INTRAVENOUS | Status: AC
  Filled 2018-03-05: qty 50

## 2018-03-05 NOTE — Anesthesia Post-op Follow-up Note (Signed)
Anesthesia QCDR form completed.        

## 2018-03-05 NOTE — H&P (Signed)
Outpatient short stay form Pre-procedure 03/05/2018 7:31 AM Lollie Sails MD  Primary Physician: Eulogio Bear NP  Reason for visit: Colonoscopy  History of present illness: Patient is a 72 year old female presenting today as above.  Her last colonoscopy was over 10 years ago apparently no polyps at that time.  She states she does have some variable bowel habits.  Did see some blood in her stool last night with her prep.  Takes 81 mg aspirin daily but no other blood thinners or aspirin products.  She tolerated her prep well.    Current Facility-Administered Medications:  .  0.9 %  sodium chloride infusion, , Intravenous, Continuous, Lollie Sails, MD  Medications Prior to Admission  Medication Sig Dispense Refill Last Dose  . amLODipine (NORVASC) 10 MG tablet Take 10 mg by mouth daily.    03/04/2018 at 0800  . benazepril (LOTENSIN) 40 MG tablet Take 40 mg by mouth daily.    03/05/2018 at 0500  . glimepiride (AMARYL) 4 MG tablet Take 4 mg by mouth 2 (two) times daily.    03/04/2018 at 0800  . Insulin Detemir (LEVEMIR FLEXPEN) 100 UNIT/ML Pen Inject 7 Units into the skin daily.   03/04/2018 at 2000  . Lactobacillus (ACIDOPHILUS PROBIOTIC) 10 MG TABS Take 10 mg by mouth 3 (three) times daily. 30 tablet 0 Past Week at Unknown time  . metFORMIN (GLUCOPHAGE) 1000 MG tablet Take 1,000 mg by mouth daily.    03/04/2018 at 0800  . Multiple Vitamin (MULTIVITAMIN) capsule Take 1 capsule by mouth daily.   Past Week at Unknown time  . Omega-3 Fatty Acids (SUPER TWIN EPA/DHA) 1250 MG CAPS Take 2 capsules by mouth daily.   Past Week at Unknown time  . clindamycin (CLEOCIN) 150 MG capsule Take 2 capsules (300 mg total) by mouth 3 (three) times daily. May dispense as 150mg  capsules (Patient not taking: Reported on 03/05/2018) 60 capsule 0 Completed Course at Unknown time     Allergies  Allergen Reactions  . Doxycycline Shortness Of Breath    Difficulty taking deep breaths  . Levaquin  [Levofloxacin] Shortness Of Breath  . Bactrim [Sulfamethoxazole-Trimethoprim]     She does not remember what her reaction is but she knows she is allergic to it.  . Ciprofloxacin Other (See Comments)  . Penicillins Swelling    Joint swelling  . Statins Other (See Comments)  . Sulfa Antibiotics Other (See Comments)     Past Medical History:  Diagnosis Date  . Chronic kidney disease   . DDD (degenerative disc disease), lumbar   . Diabetes mellitus without complication ([place])   . Diverticulitis   . GERD (gastroesophageal reflux disease)   . Hyperlipidemia   . Hypertension   . Recurrent UTI     Review of systems:      Physical Exam    Heart and lungs: Without rub or gallop, lungs are bilaterally clear.    HEENT: Normocephalic atraumatic eyes are anicteric    Other:    Pertinant exam for procedure: Soft nontender nondistended all sounds are positive normoactive    Planned proceedures: Colonoscopy and indicated procedures. I have discussed the risks benefits and complications of procedures to include not limited to bleeding, infection, perforation and the risk of sedation and the patient wishes to proceed.    Lollie Sails, MD Gastroenterology 03/05/2018  7:31 AM

## 2018-03-05 NOTE — Anesthesia Postprocedure Evaluation (Signed)
Anesthesia Post Note  Patient: Mia Todd  Procedure(s) Performed: COLONOSCOPY WITH PROPOFOL (N/A )  Patient location during evaluation: Endoscopy Anesthesia Type: General Level of consciousness: awake and alert Pain management: pain level controlled Vital Signs Assessment: post-procedure vital signs reviewed and stable Respiratory status: spontaneous breathing, nonlabored ventilation, respiratory function stable and patient connected to nasal cannula oxygen Cardiovascular status: blood pressure returned to baseline and stable Postop Assessment: no apparent nausea or vomiting Anesthetic complications: no     Last Vitals:  Vitals:   03/05/18 1012 03/05/18 1042  BP: (!) 102/49 (!) 173/70  Pulse: 66   Resp: 18   Temp: (!) 36.1 C   SpO2: 100%     Last Pain:  Vitals:   03/05/18 1012  TempSrc: Tympanic  PainSc:                  Mia Todd S

## 2018-03-05 NOTE — Op Note (Signed)
[facility] Patient Name: Mia Todd Procedure Date: 03/05/2018 7:30 AM MRN: [id number] Account #: [id number] Date of Birth: 10-16-1945 Admit Type: Outpatient Age: 72 Room: [facility] ENDO ROOM 3 Gender: Female Note Status: Finalized Procedure:            Colonoscopy Indications:          Screening for colorectal malignant neoplasm Providers:            Lollie Sails, MD Referring MD:         Dani Gobble. Dema Severin, MD (Referring MD) Medicines:            Monitored Anesthesia Care Complications:        No immediate complications. Procedure:            Pre-Anesthesia Assessment:                       - ASA Grade Assessment: III - A patient with severe                        systemic disease.                       After obtaining informed consent, the colonoscope was                        passed under direct vision. Throughout the procedure,                        the patient's blood pressure, pulse, and oxygen                        saturations were monitored continuously. The                        Colonoscope was introduced through the anus and                        advanced to the the cecum, identified by appendiceal                        orifice and ileocecal valve. The colonoscopy was                        performed with moderate difficulty due to multiple                        diverticula in the colon and a tortuous colon.                        Successful completion of the procedure was aided by                        changing the patient to a supine position and changing                        the patient to a prone position. The patient tolerated                        the procedure well. The quality of the bowel  preparation was good. Findings:      A 5 mm polyp was found in the descending colon. The polyp was sessile.       The polyp was removed with a cold snare. Resection and retrieval were       complete.     Many small and large-mouthed diverticula were found in the entire colon.      The digital rectal exam was normal. Impression:           - One 5 mm polyp in the descending colon, removed with                        a cold snare. Resected and retrieved.                       - Diverticulosis in the entire examined colon. Recommendation:       - Discharge patient to home.                       - Await pathology results.                       - Telephone GI clinic for pathology results in 1 week. Procedure Code(s):    --- Professional ---                       [phone], Colonoscopy, flexible; with removal of tumor(s),                        polyp(s), or other lesion(s) by snare technique Diagnosis Code(s):    --- Professional ---                       Z12.11, Encounter for screening for malignant neoplasm                        of colon                       D12.4, Benign neoplasm of descending colon                       K57.30, Diverticulosis of large intestine without                        perforation or abscess without bleeding CPT copyright 2017 American Medical Association. All rights reserved. The codes documented in this report are preliminary and upon coder review may  be revised to meet current compliance requirements. Lollie Sails, MD 03/05/2018 10:11:23 AM This report has been signed electronically. Number of Addenda: 0 Note Initiated On: 03/05/2018 7:30 AM Scope Withdrawal Time: 0 hours 9 minutes 32 seconds  Total Procedure Duration: 0 hours 25 minutes 59 seconds       [facility]

## 2018-03-05 NOTE — Anesthesia Preprocedure Evaluation (Signed)
Anesthesia Evaluation  Patient identified by MRN, date of birth, ID band Patient awake    Reviewed: Allergy & Precautions, NPO status , Patient's Chart, lab work & pertinent test results, reviewed documented beta blocker date and time   Airway Mallampati: II  TM Distance: >3 FB     Dental  (+) Chipped   Pulmonary former smoker,           Cardiovascular hypertension, Pt. on medications      Neuro/Psych    GI/Hepatic GERD  ,  Endo/Other  diabetes, Type 2  Renal/GU Renal disease     Musculoskeletal  (+) Arthritis ,   Abdominal   Peds  Hematology   Anesthesia Other Findings   Reproductive/Obstetrics                             Anesthesia Physical Anesthesia Plan  ASA: III  Anesthesia Plan: General   Post-op Pain Management:    Induction: Intravenous  PONV Risk Score and Plan:   Airway Management Planned:   Additional Equipment:   Intra-op Plan:   Post-operative Plan:   Informed Consent: I have reviewed the patients History and Physical, chart, labs and discussed the procedure including the risks, benefits and alternatives for the proposed anesthesia with the patient or authorized representative who has indicated his/her understanding and acceptance.     Plan Discussed with: CRNA  Anesthesia Plan Comments:         Anesthesia Quick Evaluation

## 2018-03-05 NOTE — Transfer of Care (Signed)
Immediate Anesthesia Transfer of Care Note  Patient: Mia Todd  Procedure(s) Performed: COLONOSCOPY WITH PROPOFOL (N/A )  Patient Location: PACU and Endoscopy Unit  Anesthesia Type:General  Level of Consciousness: awake, alert  and oriented  Airway & Oxygen Therapy: Patient Spontanous Breathing  Post-op Assessment: Report given to RN and Post -op Vital signs reviewed and stable  Post vital signs: Reviewed and stable  Last Vitals:  Vitals Value Taken Time  BP 102/49 03/05/2018 10:12 AM  Temp 36.1 C 03/05/2018 10:12 AM  Pulse 65 03/05/2018 10:12 AM  Resp 17 03/05/2018 10:12 AM  SpO2 100 % 03/05/2018 10:12 AM  Vitals shown include unvalidated device data.  Last Pain:  Vitals:   03/05/18 1012  TempSrc: Tympanic  PainSc:          Complications: No apparent anesthesia complications

## 2018-03-07 ENCOUNTER — Encounter: Payer: Self-pay | Admitting: Gastroenterology

## 2018-03-07 LAB — SURGICAL PATHOLOGY

## 2020-06-10 ENCOUNTER — Other Ambulatory Visit: Payer: Medicare HMO

## 2020-09-30 ENCOUNTER — Emergency Department: Payer: Medicare Other

## 2020-09-30 ENCOUNTER — Emergency Department
Admission: EM | Admit: 2020-09-30 | Discharge: 2020-09-30 | Disposition: A | Discharge status: Home / Self Care | Payer: Medicare Other | Attending: Emergency Medicine | Admitting: Emergency Medicine

## 2020-09-30 ENCOUNTER — Other Ambulatory Visit: Payer: Self-pay

## 2020-09-30 ENCOUNTER — Encounter: Payer: Self-pay | Admitting: Emergency Medicine

## 2020-09-30 DIAGNOSIS — Z79899 Other long term (current) drug therapy: Secondary | ICD-10-CM | POA: Insufficient documentation

## 2020-09-30 DIAGNOSIS — N189 Chronic kidney disease, unspecified: Secondary | ICD-10-CM | POA: Diagnosis not present

## 2020-09-30 DIAGNOSIS — Z7984 Long term (current) use of oral hypoglycemic drugs: Secondary | ICD-10-CM | POA: Diagnosis not present

## 2020-09-30 DIAGNOSIS — E1122 Type 2 diabetes mellitus with diabetic chronic kidney disease: Secondary | ICD-10-CM | POA: Diagnosis not present

## 2020-09-30 DIAGNOSIS — I129 Hypertensive chronic kidney disease with stage 1 through stage 4 chronic kidney disease, or unspecified chronic kidney disease: Secondary | ICD-10-CM | POA: Diagnosis not present

## 2020-09-30 DIAGNOSIS — Z794 Long term (current) use of insulin: Secondary | ICD-10-CM | POA: Diagnosis not present

## 2020-09-30 DIAGNOSIS — Z87891 Personal history of nicotine dependence: Secondary | ICD-10-CM | POA: Insufficient documentation

## 2020-09-30 DIAGNOSIS — M79661 Pain in right lower leg: Secondary | ICD-10-CM | POA: Diagnosis present

## 2020-09-30 NOTE — ED Provider Notes (Signed)
[facility] Emergency Department Provider Note ____________________________________________   Event Date/Time   First MD Initiated Contact with Patient 09/30/20 1304     (approximate)  I have reviewed the triage vital signs and the nursing notes.   HISTORY  Chief Complaint Leg Pain    HPI Mia Todd is a 75 y.o. female with PMH as noted below who presents with right leg pain since yesterday, constant, and associated with swelling. The pain is worst in the back of her calf. There is no rash. She is able to walk and bear weight on it. She denies any preceding injury or trauma. She has no prior history of similar symptoms.  Past Medical History:  Diagnosis Date  . Chronic kidney disease   . DDD (degenerative disc disease), lumbar   . Diabetes mellitus without complication ([place])   . Diverticulitis   . GERD (gastroesophageal reflux disease)   . Hyperlipidemia   . Hypertension   . Recurrent UTI     There are no problems to display for this patient.   Past Surgical History:  Procedure Laterality Date  . BACK SURGERY    . BREAST CYST ASPIRATION Left 1974   neg  . COLONOSCOPY WITH PROPOFOL N/A 03/05/2018   Procedure: COLONOSCOPY WITH PROPOFOL;  Surgeon: Lollie Sails, MD;  Location: [facility] ENDOSCOPY;  Service: Endoscopy;  Laterality: N/A;  . DILATION AND CURETTAGE OF UTERUS    . LUMBAR FUSION    . REMOVE OVARIAN CYST    . SPINAL FUSION    . TUBAL LIGATION      Prior to Admission medications   Medication Sig Start Date End Date Taking? Authorizing Provider  amLODipine (NORVASC) 10 MG tablet Take 10 mg by mouth daily.  02/12/14   [provider]  benazepril (LOTENSIN) 40 MG tablet Take 40 mg by mouth daily.  12/04/14   [provider]  clindamycin (CLEOCIN) 150 MG capsule Take 2 capsules (300 mg total) by mouth 3 (three) times daily. May dispense as 150mg  capsules Patient not taking: Reported on 03/05/2018 07/07/15   Waynetta Pean, PA-C  glimepiride (AMARYL) 4 MG tablet Take 4 mg by mouth 2 (two) times daily.  02/12/14   [provider]  Insulin Detemir (LEVEMIR FLEXPEN) 100 UNIT/ML Pen Inject 7 Units into the skin daily. 02/19/14   [provider]  Lactobacillus (ACIDOPHILUS PROBIOTIC) 10 MG TABS Take 10 mg by mouth 3 (three) times daily. 07/07/15   Waynetta Pean, PA-C  metFORMIN (GLUCOPHAGE) 1000 MG tablet Take 1,000 mg by mouth daily.  02/12/14   [provider]  Multiple Vitamin (MULTIVITAMIN) capsule Take 1 capsule by mouth daily.    [provider]  Omega-3 Fatty Acids (SUPER TWIN EPA/DHA) 1250 MG CAPS Take 2 capsules by mouth daily. 05/18/09   [provider]    Allergies Doxycycline, Levaquin [levofloxacin], Bactrim [sulfamethoxazole-trimethoprim], Ciprofloxacin, Penicillins, Statins, and Sulfa antibiotics  Family History  Problem Relation Age of Onset  . Breast cancer Neg Hx     Social History Social History   Tobacco Use  . Smoking status: Former Smoker    Packs/day: 1.00    Years: 15.00    Pack years: 15.00    Quit date: 08/15/1991    Years since quitting: 29.1  . Smokeless tobacco: Never Used  Vaping Use  . Vaping Use: Never used  Substance Use Topics  . Alcohol use: Yes    Comment: 5-7 GLASSES WINE/WEEK  . Drug use: Never  Review of Systems  Constitutional: No fever. Eyes: No redness. ENT: No sore throat. Cardiovascular: Denies chest pain. Respiratory: Denies shortness of breath. Gastrointestinal: No vomiting. Genitourinary: Negative for flank pain Musculoskeletal: Negative for back pain. Positive for right leg pain Skin: Negative for rash. Neurological: Negative for headache.   ____________________________________________   PHYSICAL EXAM:  VITAL SIGNS: ED Triage Vitals  Enc Vitals Group     BP 09/30/20 1159 (!) 195/84     Pulse Rate 09/30/20 1159 69     Resp 09/30/20 1159 18     Temp 09/30/20 1159 98.2 F (36.8 C)      Temp Source 09/30/20 1159 Oral     SpO2 09/30/20 1159 99 %     Weight 09/30/20 1121 134 lb 14.7 oz (61.2 kg)     Height 09/30/20 1121 5\' 5"  (1.651 m)     Head Circumference --      Peak Flow --      Pain Score 09/30/20 1121 6     Pain Loc --      Pain Edu? --      Excl. in [place]? --     Constitutional: Alert and oriented. Well appearing and in no acute distress. Eyes: Conjunctivae are normal.  Head: Atraumatic. Nose: No congestion/rhinnorhea. Mouth/Throat: Mucous membranes are moist.   Neck: Normal range of motion.  Cardiovascular: Normal rate, regular rhythm.  Good peripheral circulation. Respiratory: Normal respiratory effort.  No retractions. Gastrointestinal: No distention.  Musculoskeletal: No lower extremity edema.  Extremities warm and well perfused. Mild tenderness to the posterior right calf. Minimal swelling. No erythema, induration, or abnormal warmth. No popliteal tenderness. 2+ DP pulse Neurologic:  Normal speech and language. No gross focal neurologic deficits are appreciated.  Skin:  Skin is warm and dry. No rash noted. Psychiatric: Mood and affect are normal. Speech and behavior are normal.  ____________________________________________   LABS (all labs ordered are listed, but only abnormal results are displayed)  Labs Reviewed - No data to display ____________________________________________  EKG   ____________________________________________  RADIOLOGY  US venous RLE: No acute DVT or other acute abnormality ____________________________________________   PROCEDURES  Procedure(s) performed: No  Procedures  Critical Care performed: No ____________________________________________   INITIAL IMPRESSION / ASSESSMENT AND PLAN / ED COURSE  Pertinent labs & imaging results that were available during my care of the patient were reviewed by me and considered in my medical decision making (see chart for details).  75 year old female with PMH as noted above  presents with atraumatic right calf pain since last night.  On exam she is well-appearing. Her vital signs are normal except for hypertension. She did not take her blood pressure medication this morning. She has mild right calf tenderness and minimal swelling there but no other exam abnormalities. RLE is neuro/vascular intact.  RLE ultrasound was obtained and shows no evidence of acute DVT. There is no evidence of cellulitis. Presentation is consistent with muscle strain/spasm or other benign etiology.  The patient is significantly hypertensive but did not take her antihypertensive this morning. She has no chest pain, shortness of breath, headache, or other acute symptoms to suggest hypertensive urgency. There is no indication for further work-up.  At this time, the patient is stable for discharge home. Return precautions given, and she expresses understanding.  ____________________________________________   FINAL CLINICAL IMPRESSION(S) / ED DIAGNOSES  Final diagnoses:  Right calf pain      NEW MEDICATIONS STARTED DURING THIS VISIT:  New Prescriptions   No medications  on file     Note:  This document was prepared using Dragon voice recognition software and may include unintentional dictation errors.   Arta Silence, MD 09/30/20 1335

## 2020-09-30 NOTE — ED Notes (Signed)
Patient in US.

## 2020-09-30 NOTE — Discharge Instructions (Addendum)
You have no signs of a DVT (blood clot).  The leg pain is likely due to a muscle strain or spasm.  Take your normal blood pressure medication as prescribed.  You may take ibuprofen, Aleve, or Tylenol as needed for pain and keep the leg elevated.  Do not do any strenuous activity on the right leg over the next few days.  Return to the ER for new, worsening, or persistent severe pain, inability to bear weight on the leg, worsening swelling, redness, numbness or weakness, or any other new or worsening symptoms that concern you.

## 2020-09-30 NOTE — ED Triage Notes (Signed)
C/O right lower leg pain and swelling.

## 2020-12-03 ENCOUNTER — Emergency Department: Payer: Medicare Other

## 2020-12-03 ENCOUNTER — Emergency Department
Admission: EM | Admit: 2020-12-03 | Discharge: 2020-12-03 | Disposition: A | Discharge status: Home / Self Care | Payer: Medicare Other | Attending: Emergency Medicine | Admitting: Emergency Medicine

## 2020-12-03 ENCOUNTER — Other Ambulatory Visit: Payer: Self-pay

## 2020-12-03 DIAGNOSIS — I129 Hypertensive chronic kidney disease with stage 1 through stage 4 chronic kidney disease, or unspecified chronic kidney disease: Secondary | ICD-10-CM | POA: Insufficient documentation

## 2020-12-03 DIAGNOSIS — R0781 Pleurodynia: Secondary | ICD-10-CM | POA: Diagnosis not present

## 2020-12-03 DIAGNOSIS — Z87891 Personal history of nicotine dependence: Secondary | ICD-10-CM | POA: Insufficient documentation

## 2020-12-03 DIAGNOSIS — N189 Chronic kidney disease, unspecified: Secondary | ICD-10-CM | POA: Diagnosis not present

## 2020-12-03 DIAGNOSIS — Z794 Long term (current) use of insulin: Secondary | ICD-10-CM | POA: Diagnosis not present

## 2020-12-03 DIAGNOSIS — M791 Myalgia, unspecified site: Secondary | ICD-10-CM | POA: Insufficient documentation

## 2020-12-03 DIAGNOSIS — R109 Unspecified abdominal pain: Secondary | ICD-10-CM | POA: Diagnosis present

## 2020-12-03 DIAGNOSIS — Z7984 Long term (current) use of oral hypoglycemic drugs: Secondary | ICD-10-CM | POA: Diagnosis not present

## 2020-12-03 DIAGNOSIS — Z79899 Other long term (current) drug therapy: Secondary | ICD-10-CM | POA: Diagnosis not present

## 2020-12-03 DIAGNOSIS — R030 Elevated blood-pressure reading, without diagnosis of hypertension: Secondary | ICD-10-CM

## 2020-12-03 DIAGNOSIS — M7918 Myalgia, other site: Secondary | ICD-10-CM

## 2020-12-03 DIAGNOSIS — E1122 Type 2 diabetes mellitus with diabetic chronic kidney disease: Secondary | ICD-10-CM | POA: Diagnosis not present

## 2020-12-03 LAB — URINALYSIS, COMPLETE (UACMP) WITH MICROSCOPIC
Bilirubin Urine: NEGATIVE
Glucose, UA: 50 mg/dL — AB
Hgb urine dipstick: NEGATIVE
Ketones, ur: NEGATIVE mg/dL
Leukocytes,Ua: NEGATIVE
Nitrite: NEGATIVE
Protein, ur: NEGATIVE mg/dL
Specific Gravity, Urine: 1.009 (ref 1.005–1.030)
pH: 6 (ref 5.0–8.0)

## 2020-12-03 LAB — BASIC METABOLIC PANEL
Anion gap: 11 (ref 5–15)
BUN: 11 mg/dL (ref 8–23)
CO2: 22 mmol/L (ref 22–32)
Calcium: 9.9 mg/dL (ref 8.9–10.3)
Chloride: 106 mmol/L (ref 98–111)
Creatinine, Ser: 0.79 mg/dL (ref 0.44–1.00)
GFR, Estimated: >60 mL/min (ref >60)
Glucose, Bld: 90 mg/dL (ref 70–99)
Potassium: 3.9 mmol/L (ref 3.5–5.1)
Sodium: 139 mmol/L (ref 135–145)

## 2020-12-03 LAB — CBC
HCT: 42.3 % (ref 36.0–46.0)
Hemoglobin: 14.1 g/dL (ref 12.0–15.0)
MCH: 29.1 pg (ref 26.0–34.0)
MCHC: 33.3 g/dL (ref 30.0–36.0)
MCV: 87.2 fL (ref 80.0–100.0)
Platelets: 240 10*3/uL (ref 150–400)
RBC: 4.85 MIL/uL (ref 3.87–5.11)
RDW: 13.6 % (ref 11.5–15.5)
WBC: 5 10*3/uL (ref 4.0–10.5)
nRBC: 0 % (ref 0.0–0.2)

## 2020-12-03 MED ORDER — OXYCODONE-ACETAMINOPHEN 5-325 MG PO TABS: 1.0000 | ORAL_TABLET | Freq: Once | ORAL | Status: DC

## 2020-12-03 MED ORDER — ACETAMINOPHEN 325 MG PO TABS
650.0000 mg | ORAL_TABLET | Freq: Once | ORAL | Status: AC
  Administered 2020-12-03: 650 mg via ORAL
  Filled 2020-12-03: qty 2

## 2020-12-03 MED ORDER — OXYCODONE-ACETAMINOPHEN 5-325 MG PO TABS: 1.0000 | ORAL_TABLET | Freq: Three times a day (TID) | ORAL | 0 refills | Status: DC | PRN

## 2020-12-03 NOTE — ED Notes (Signed)
Pt presents to ED with c/o of R sided flank/rib pain. Pt states she recently possibly had the flu, pt states COVID test was negative. Pt states HX of UTI's and states recently having a strong odor to her urine. Pt denies fevers or chills. Pt denies injury or trama to this area. Pt is A&Ox4. NAD noted.

## 2020-12-03 NOTE — ED Notes (Signed)
D/C and f/up discussed with pt, pt verbalized understanding. Pt ambulatory on D/C with steady gait.  Pt refuses percocet due to pt driving, PA aware

## 2020-12-03 NOTE — ED Notes (Signed)
This RN and Renato Gails, RN attempted PIV with no success, PA states she needs IV and blood work. IV team consult to be placed.

## 2020-12-03 NOTE — ED Triage Notes (Signed)
Pt arrived to ed via pov. Pt reports flank pain starting 7 days ago. Pt reports strong urine odor, denies any blood in urine or difficulty voiding. Pain increases with movement. NAD noted at this time

## 2020-12-03 NOTE — ED Provider Notes (Signed)
[facility] Emergency Department Provider Note   ____________________________________________   Event Date/Time   First MD Initiated Contact with Patient 12/03/20 1416     (approximate)  I have reviewed the triage vital signs and the nursing notes.   HISTORY  Chief Complaint Flank Pain    HPI Mia Todd is a 75 y.o. female presents to the ED with complaint of right-sided flank pain for the last 7 days.  Denies any injury, fever, chills, nausea or vomiting.  Patient does have a history of UTIs and states that her urine has had an odor.  She denies any past kidney stones.  She also reports that pain increases with deep breaths and movement.  She has been taking BC powders to help control the pain with the last 1 being last evening.  She denies any abdominal pain or diarrhea.  She reports that 2 weeks ago she was sick and did have a COVID test done at [facility] where it was reported as negative.  She rates her pain as an 8 out of 10.      Past Medical History:  Diagnosis Date  . Chronic kidney disease   . DDD (degenerative disc disease), lumbar   . Diabetes mellitus without complication ([employer])   . Diverticulitis   . GERD (gastroesophageal reflux disease)   . Hyperlipidemia   . Hypertension   . Recurrent UTI     There are no problems to display for this patient.   Past Surgical History:  Procedure Laterality Date  . BACK SURGERY    . BREAST CYST ASPIRATION Left 1974   neg  . COLONOSCOPY WITH PROPOFOL N/A 03/05/2018   Procedure: COLONOSCOPY WITH PROPOFOL;  Surgeon: Lollie Sails, MD;  Location: [facility] ENDOSCOPY;  Service: Endoscopy;  Laterality: N/A;  . DILATION AND CURETTAGE OF UTERUS    . LUMBAR FUSION    . REMOVE OVARIAN CYST    . SPINAL FUSION    . TUBAL LIGATION      Prior to Admission medications   Medication Sig Start Date End Date Taking? Authorizing Provider  oxyCODONE-acetaminophen (PERCOCET) 5-325 MG tablet Take 1 tablet by mouth  every 8 (eight) hours as needed for severe pain. 12/03/20  Yes Letitia Neri L, PA-C  amLODipine (NORVASC) 10 MG tablet Take 10 mg by mouth daily.  02/12/14   [provider]  benazepril (LOTENSIN) 40 MG tablet Take 40 mg by mouth daily.  12/04/14   [provider]  clindamycin (CLEOCIN) 150 MG capsule Take 2 capsules (300 mg total) by mouth 3 (three) times daily. May dispense as 150mg  capsules Patient not taking: Reported on 03/05/2018 07/07/15   Waynetta Pean, PA-C  glimepiride (AMARYL) 4 MG tablet Take 4 mg by mouth 2 (two) times daily.  02/12/14   [provider]  Insulin Detemir (LEVEMIR FLEXPEN) 100 UNIT/ML Pen Inject 7 Units into the skin daily. 02/19/14   [provider]  Lactobacillus (ACIDOPHILUS PROBIOTIC) 10 MG TABS Take 10 mg by mouth 3 (three) times daily. 07/07/15   Waynetta Pean, PA-C  metFORMIN (GLUCOPHAGE) 1000 MG tablet Take 1,000 mg by mouth daily.  02/12/14   [provider]  Multiple Vitamin (MULTIVITAMIN) capsule Take 1 capsule by mouth daily.    [provider]  Omega-3 Fatty Acids (SUPER TWIN EPA/DHA) 1250 MG CAPS Take 2 capsules by mouth daily. 05/18/09   [provider]    Allergies Doxycycline, Levaquin [levofloxacin], Bactrim [sulfamethoxazole-trimethoprim], Ciprofloxacin, Penicillins, Statins, and Sulfa antibiotics  Family History  Problem Relation Age of Onset  . Breast cancer Neg Hx     Social History Social History   Tobacco Use  . Smoking status: Former Smoker    Packs/day: 1.00    Years: 15.00    Pack years: 15.00    Quit date: 08/15/1991    Years since quitting: 29.3  . Smokeless tobacco: Never Used  Vaping Use  . Vaping Use: Never used  Substance Use Topics  . Alcohol use: Yes    Comment: 5-7 GLASSES WINE/WEEK  . Drug use: Never    Review of Systems Constitutional: No fever/chills Eyes: No visual changes. ENT: No sore throat. Cardiovascular: Denies chest pain. Respiratory: Denies  shortness of breath.  Positive cough 2 weeks ago improved. Gastrointestinal: No abdominal pain.  Positive right flank pain.  No nausea, no vomiting.  No diarrhea.  No constipation. Genitourinary: Negative for dysuria.  Reported history of UTIs. Musculoskeletal: Negative for back pain. Skin: Negative for rash. Neurological: Negative for headaches, focal weakness or numbness.   ____________________________________________   PHYSICAL EXAM:  VITAL SIGNS: ED Triage Vitals  Enc Vitals Group     BP 12/03/20 1418 (!) 199/91     Pulse Rate 12/03/20 1418 71     Resp 12/03/20 1418 18     Temp 12/03/20 1418 98.1 F (36.7 C)     Temp src --      SpO2 12/03/20 1418 100 %     Weight 12/03/20 1412 132 lb (59.9 kg)     Height 12/03/20 1412 5\' 5"  (1.651 m)     Head Circumference --      Peak Flow --      Pain Score 12/03/20 1412 8     Pain Loc --      Pain Edu? --      Excl. in [place]? --    Constitutional: Alert and oriented. Well appearing and in no acute distress. Eyes: Conjunctivae are normal.  Head: Atraumatic. Nose: No congestion/rhinnorhea. Mouth/Throat: Mucous membranes are moist.  Oropharynx non-erythematous. Neck: No stridor.   Cardiovascular: Normal rate, regular rhythm. Grossly normal heart sounds.  Good peripheral circulation. Respiratory: Normal respiratory effort.  No retractions. Lungs CTAB.  There is some tenderness on palpation of the right lateral ribs.  No evidence of injury or skin discoloration. Gastrointestinal: Soft and nontender. No distention.  Bowel sounds normoactive x4 quadrants.  Mild CVA tenderness noted on right. Musculoskeletal: No lower extremity tenderness nor edema.   Neurologic:  Normal speech and language. No gross focal neurologic deficits are appreciated. No gait instability. Skin:  Skin is warm, dry and intact. No rash noted. Psychiatric: Mood and affect are normal. Speech and behavior are normal.  ____________________________________________    LABS (all labs ordered are listed, but only abnormal results are displayed)  Labs Reviewed  URINALYSIS, COMPLETE (UACMP) WITH MICROSCOPIC - Abnormal; Notable for the following components:      Result Value   Color, Urine YELLOW (*)    APPearance CLEAR (*)    Glucose, UA 50 (*)    Bacteria, UA RARE (*)    All other components within normal limits  BASIC METABOLIC PANEL  CBC   ____________________________________________  RADIOLOGY Leana Gamer, personally viewed and evaluated these images (plain radiographs) as part of my medical decision making, as well as reviewing the written report by the radiologist.    Official radiology report(s): DG Chest Port 1 View  Result Date: 12/03/2020 CLINICAL DATA:  Right lateral chest  pain x1 week.  No known injury. EXAM: PORTABLE CHEST 1 VIEW COMPARISON:  April 15, 2008 FINDINGS: The heart size and mediastinal contours are within normal limits. Atherosclerotic calcifications of the aortic arch. No focal consolidation. No significant pleural effusion or visible pneumothorax. No displaced rib fracture visualized. Degenerative changes bilateral shoulders. IMPRESSION: 1. No acute cardiopulmonary process. 2. No displaced rib fracture visualized. Electronically Signed   By: Dahlia Bailiff MD   On: 12/03/2020 15:10    ____________________________________________   PROCEDURES  Procedure(s) performed (including Critical Care):  Procedures   ____________________________________________   INITIAL IMPRESSION / ASSESSMENT AND PLAN / ED COURSE  As part of my medical decision making, I reviewed the following data within the electronic MEDICAL RECORD NUMBER Notes from prior ED visits and [place] Controlled Substance Database  75 year old female presents to the ED with complaint of right sided/rib pain for approximately 1 week.  Patient states she has been trying to control this with over-the-counter medications without any relief.  Patient reports pain  increases with movement and denies any injury to this area.  Patient was not given any pain medication while in the ED as she was driving.  Lab work is reassuring and chest x-ray was negative for rib fractures or cardiopulmonary changes.  Patient states that she does not want to take any pain medication here and will pick the prescription up at her pharmacy.  She is encouraged to use ice or heat to her muscles as needed.  She is also encouraged to follow-up with her PCP for recheck of her blood pressure.  Patient feels that her blood pressure is elevated due to her pain.  ____________________________________________   FINAL CLINICAL IMPRESSION(S) / ED DIAGNOSES  Final diagnoses:  Musculoskeletal pain  Elevated blood pressure reading     ED Discharge Orders         Ordered    oxyCODONE-acetaminophen (PERCOCET) 5-325 MG tablet  Every 8 hours PRN        12/03/20 1843          *Please note:  Kishana Stinchfield was evaluated in Emergency Department on 12/03/2020 for the symptoms described in the history of present illness. She was evaluated in the context of the global COVID-19 pandemic, which necessitated consideration that the patient might be at risk for infection with the SARS-CoV-2 virus that causes COVID-19. Institutional protocols and algorithms that pertain to the evaluation of patients at risk for COVID-19 are in a state of rapid change based on information released by regulatory bodies including the CDC and federal and state organizations. These policies and algorithms were followed during the patient's care in the ED.  Some ED evaluations and interventions may be delayed as a result of limited staffing during and the pandemic.*   Note:  This document was prepared using Dragon voice recognition software and may include unintentional dictation errors.    Johnn Hai, PA-C 12/03/20 Warnell Bureau, MD 12/04/20 [phone]

## 2020-12-03 NOTE — Discharge Instructions (Addendum)
Follow-up with your primary care provider if any continued problems with your muscle pain.  Also you may use heat or ice to your muscles as needed for discomfort.  Take medication only as directed and be aware that this could cause drowsiness and increase your risk for falling.  Also it is a good idea to take stool softeners with this medication as it could cause constipation.  Once your pain is under control you should have your primary care provider recheck your blood pressure as it was elevated while in the emergency department.  Return to the emergency department if any worsening of your symptoms or urgent concerns.

## 2020-12-17 ENCOUNTER — Other Ambulatory Visit: Payer: Self-pay | Admitting: Family Medicine

## 2020-12-17 ENCOUNTER — Other Ambulatory Visit: Payer: Self-pay

## 2020-12-17 ENCOUNTER — Ambulatory Visit
Admission: RE | Admit: 2020-12-17 | Discharge: 2020-12-17 | Disposition: A | Discharge status: Home / Self Care | Payer: Medicare Other | Source: Ambulatory Visit | Attending: Family Medicine | Admitting: Family Medicine

## 2020-12-17 DIAGNOSIS — R109 Unspecified abdominal pain: Secondary | ICD-10-CM

## 2020-12-17 DIAGNOSIS — K5732 Diverticulitis of large intestine without perforation or abscess without bleeding: Secondary | ICD-10-CM | POA: Diagnosis present

## 2020-12-17 DIAGNOSIS — R10A1 Flank pain, right side: Secondary | ICD-10-CM

## 2020-12-17 MED ORDER — IOHEXOL 300 MG/ML  SOLN: 100.0000 mL | Freq: Once | INTRAMUSCULAR | Status: DC | PRN

## 2021-01-06 ENCOUNTER — Inpatient Hospital Stay (HOSPITAL_COMMUNITY)
Admission: EM | Admit: 2021-01-06 | Discharge: 2021-01-08 | DRG: 378 | Disposition: A | Discharge status: Home / Self Care | Payer: Medicare Other | Attending: Family Medicine | Admitting: Family Medicine

## 2021-01-06 ENCOUNTER — Encounter (HOSPITAL_COMMUNITY): Payer: Self-pay | Admitting: *Deleted

## 2021-01-06 ENCOUNTER — Other Ambulatory Visit: Payer: Self-pay

## 2021-01-06 DIAGNOSIS — K219 Gastro-esophageal reflux disease without esophagitis: Secondary | ICD-10-CM | POA: Diagnosis not present

## 2021-01-06 DIAGNOSIS — N189 Chronic kidney disease, unspecified: Secondary | ICD-10-CM | POA: Diagnosis present

## 2021-01-06 DIAGNOSIS — I129 Hypertensive chronic kidney disease with stage 1 through stage 4 chronic kidney disease, or unspecified chronic kidney disease: Secondary | ICD-10-CM | POA: Diagnosis present

## 2021-01-06 DIAGNOSIS — Y929 Unspecified place or not applicable: Secondary | ICD-10-CM

## 2021-01-06 DIAGNOSIS — K254 Chronic or unspecified gastric ulcer with hemorrhage: Secondary | ICD-10-CM | POA: Diagnosis not present

## 2021-01-06 DIAGNOSIS — E119 Type 2 diabetes mellitus without complications: Secondary | ICD-10-CM

## 2021-01-06 DIAGNOSIS — T39395A Adverse effect of other nonsteroidal anti-inflammatory drugs [NSAID], initial encounter: Secondary | ICD-10-CM | POA: Diagnosis present

## 2021-01-06 DIAGNOSIS — Z981 Arthrodesis status: Secondary | ICD-10-CM

## 2021-01-06 DIAGNOSIS — Z87891 Personal history of nicotine dependence: Secondary | ICD-10-CM

## 2021-01-06 DIAGNOSIS — K922 Gastrointestinal hemorrhage, unspecified: Secondary | ICD-10-CM | POA: Diagnosis not present

## 2021-01-06 DIAGNOSIS — E1122 Type 2 diabetes mellitus with diabetic chronic kidney disease: Secondary | ICD-10-CM | POA: Diagnosis present

## 2021-01-06 DIAGNOSIS — Z881 Allergy status to other antibiotic agents status: Secondary | ICD-10-CM

## 2021-01-06 DIAGNOSIS — Z20822 Contact with and (suspected) exposure to covid-19: Secondary | ICD-10-CM | POA: Diagnosis present

## 2021-01-06 DIAGNOSIS — E1165 Type 2 diabetes mellitus with hyperglycemia: Secondary | ICD-10-CM | POA: Diagnosis present

## 2021-01-06 DIAGNOSIS — D62 Acute posthemorrhagic anemia: Secondary | ICD-10-CM | POA: Diagnosis present

## 2021-01-06 DIAGNOSIS — Z7984 Long term (current) use of oral hypoglycemic drugs: Secondary | ICD-10-CM

## 2021-01-06 DIAGNOSIS — E785 Hyperlipidemia, unspecified: Secondary | ICD-10-CM | POA: Diagnosis present

## 2021-01-06 DIAGNOSIS — Z794 Long term (current) use of insulin: Secondary | ICD-10-CM

## 2021-01-06 DIAGNOSIS — K5731 Diverticulosis of large intestine without perforation or abscess with bleeding: Secondary | ICD-10-CM | POA: Diagnosis present

## 2021-01-06 DIAGNOSIS — Z7982 Long term (current) use of aspirin: Secondary | ICD-10-CM

## 2021-01-06 DIAGNOSIS — Z888 Allergy status to other drugs, medicaments and biological substances status: Secondary | ICD-10-CM

## 2021-01-06 DIAGNOSIS — I10 Essential (primary) hypertension: Secondary | ICD-10-CM | POA: Diagnosis present

## 2021-01-06 DIAGNOSIS — Z882 Allergy status to sulfonamides status: Secondary | ICD-10-CM

## 2021-01-06 DIAGNOSIS — Z79899 Other long term (current) drug therapy: Secondary | ICD-10-CM

## 2021-01-06 DIAGNOSIS — Z880 Allergy status to penicillin: Secondary | ICD-10-CM

## 2021-01-06 DIAGNOSIS — Z7952 Long term (current) use of systemic steroids: Secondary | ICD-10-CM

## 2021-01-06 DIAGNOSIS — K5730 Diverticulosis of large intestine without perforation or abscess without bleeding: Secondary | ICD-10-CM | POA: Diagnosis present

## 2021-01-06 LAB — URINALYSIS, ROUTINE W REFLEX MICROSCOPIC
Bilirubin Urine: NEGATIVE
Glucose, UA: 50 mg/dL — AB
Hgb urine dipstick: NEGATIVE
Ketones, ur: NEGATIVE mg/dL
Leukocytes,Ua: NEGATIVE
Nitrite: NEGATIVE
Protein, ur: NEGATIVE mg/dL
Specific Gravity, Urine: 1.003 — ABNORMAL LOW (ref 1.005–1.030)
pH: 6 (ref 5.0–8.0)

## 2021-01-06 LAB — CBC WITH DIFFERENTIAL/PLATELET
Abs Immature Granulocytes: 0.02 10*3/uL (ref 0.00–0.07)
Basophils Absolute: 0 10*3/uL (ref 0.0–0.1)
Basophils Relative: 0 %
Eosinophils Absolute: 0.1 10*3/uL (ref 0.0–0.5)
Eosinophils Relative: 2 %
HCT: 30.8 % — ABNORMAL LOW (ref 36.0–46.0)
Hemoglobin: 10.1 g/dL — ABNORMAL LOW (ref 12.0–15.0)
Immature Granulocytes: 0 %
Lymphocytes Relative: 25 %
Lymphs Abs: 1.5 10*3/uL (ref 0.7–4.0)
MCH: 29.4 pg (ref 26.0–34.0)
MCHC: 32.8 g/dL (ref 30.0–36.0)
MCV: 89.8 fL (ref 80.0–100.0)
Monocytes Absolute: 0.4 10*3/uL (ref 0.1–1.0)
Monocytes Relative: 6 %
Neutro Abs: 3.9 10*3/uL (ref 1.7–7.7)
Neutrophils Relative %: 67 %
Platelets: 359 10*3/uL (ref 150–400)
RBC: 3.43 MIL/uL — ABNORMAL LOW (ref 3.87–5.11)
RDW: 14.6 % (ref 11.5–15.5)
WBC: 5.9 10*3/uL (ref 4.0–10.5)
nRBC: 0 % (ref 0.0–0.2)

## 2021-01-06 LAB — COMPREHENSIVE METABOLIC PANEL
ALT: 15 U/L (ref 0–44)
AST: 17 U/L (ref 15–41)
Albumin: 4 g/dL (ref 3.5–5.0)
Alkaline Phosphatase: 50 U/L (ref 38–126)
Anion gap: 9 (ref 5–15)
BUN: 11 mg/dL (ref 8–23)
CO2: 22 mmol/L (ref 22–32)
Calcium: 9.8 mg/dL (ref 8.9–10.3)
Chloride: 102 mmol/L (ref 98–111)
Creatinine, Ser: 0.85 mg/dL (ref 0.44–1.00)
GFR, Estimated: >60 mL/min (ref >60)
Glucose, Bld: 254 mg/dL — ABNORMAL HIGH (ref 70–99)
Potassium: 4.3 mmol/L (ref 3.5–5.1)
Sodium: 133 mmol/L — ABNORMAL LOW (ref 135–145)
Total Bilirubin: 0.7 mg/dL (ref 0.3–1.2)
Total Protein: 7 g/dL (ref 6.5–8.1)

## 2021-01-06 LAB — LIPASE, BLOOD: Lipase: 27 U/L (ref 11–51)

## 2021-01-06 LAB — TYPE AND SCREEN
ABO/RH(D): A POS
Antibody Screen: NEGATIVE

## 2021-01-06 LAB — RETICULOCYTES
Immature Retic Fract: 23.7 % — ABNORMAL HIGH (ref 2.3–15.9)
RBC.: 3.29 MIL/uL — ABNORMAL LOW (ref 3.87–5.11)
Retic Count, Absolute: 121.4 10*3/uL (ref 19.0–186.0)
Retic Ct Pct: 3.7 % — ABNORMAL HIGH (ref 0.4–3.1)

## 2021-01-06 LAB — HEMOGLOBIN AND HEMATOCRIT, BLOOD
HCT: 29.5 % — ABNORMAL LOW (ref 36.0–46.0)
Hemoglobin: 9.6 g/dL — ABNORMAL LOW (ref 12.0–15.0)

## 2021-01-06 LAB — RESP PANEL BY RT-PCR (FLU A&B, COVID) ARPGX2
Influenza A by PCR: NEGATIVE
Influenza B by PCR: NEGATIVE
SARS Coronavirus 2 by RT PCR: NEGATIVE

## 2021-01-06 MED ORDER — INSULIN ASPART 100 UNIT/ML IJ SOLN
0.0000 [IU] | Freq: Four times a day (QID) | INTRAMUSCULAR | Status: DC
  Administered 2021-01-07: 1 [IU] via SUBCUTANEOUS

## 2021-01-06 MED ORDER — FENTANYL CITRATE (PF) 100 MCG/2ML IJ SOLN
25.0000 ug | INTRAMUSCULAR | Status: DC | PRN
  Administered 2021-01-06: 25 ug via INTRAVENOUS
  Filled 2021-01-06: qty 2

## 2021-01-06 MED ORDER — PANTOPRAZOLE SODIUM 40 MG IV SOLR
40.0000 mg | Freq: Once | INTRAVENOUS | Status: AC
  Administered 2021-01-06: 40 mg via INTRAVENOUS
  Filled 2021-01-06: qty 40

## 2021-01-06 MED ORDER — SODIUM CHLORIDE 0.9 % IV SOLN
8.0000 mg/h | INTRAVENOUS | Status: DC
  Administered 2021-01-06 – 2021-01-07 (×2): 8 mg/h via INTRAVENOUS
  Filled 2021-01-06 (×3): qty 80

## 2021-01-06 MED ORDER — ONDANSETRON HCL 4 MG/2ML IJ SOLN: 4.0000 mg | Freq: Four times a day (QID) | INTRAMUSCULAR | Status: DC | PRN

## 2021-01-06 MED ORDER — SODIUM CHLORIDE 0.9 % IV SOLN: INTRAVENOUS | Status: DC

## 2021-01-06 NOTE — ED Provider Notes (Signed)
[place] EMERGENCY DEPARTMENT Provider Note   CSN: [id number] Arrival date & time: 01/06/21  1736     History Chief Complaint  Patient presents with  . Abdominal Pain  . Rectal Bleeding    Mia Todd is a 75 y.o. female.  HPI  Presents with blood in her stool, dark black tarry stools.  Symptoms have been present for 5 days.  She notes that she has been taking BC powder multiple times a day for the past month due to a pain in the right side of her abdomen for which she has been worked up but not had a formal diagnosis.  She thought these could be the cause so she stopped taking them for 5 days ago.  She has been having 4-6 black tarry stools a day for 4 to 5 days and she feels they are starting to lighten up today.  She has had occasional streaking of bright red blood in her stool but not significant degrees of prior blood in her stool.  No lightheadedness or fatigue.  She discussed this with her doctor today who recommended she come to the ER.  She does not take blood thinners. Her pain in her right side is very tolerable right now. She feels like it is a bulge that has been there for a month. No heavy alcohol use or liver disease     Past Medical History:  Diagnosis Date  . Chronic kidney disease   . DDD (degenerative disc disease), lumbar   . Diabetes mellitus without complication ([place])   . Diverticulitis   . GERD (gastroesophageal reflux disease)   . Hyperlipidemia   . Hypertension   . Recurrent UTI     Patient Active Problem List   Diagnosis Date Noted  . Acute upper GI bleed 01/06/2021  . Acute blood loss anemia 01/06/2021  . Hypertension   . Diabetes mellitus without complication ([place])   . GERD (gastroesophageal reflux disease)     Past Surgical History:  Procedure Laterality Date  . BACK SURGERY    . BREAST CYST ASPIRATION Left 1974   neg  . COLONOSCOPY WITH PROPOFOL N/A 03/05/2018   Procedure: COLONOSCOPY WITH PROPOFOL;  Surgeon:  Lollie Sails, MD;  Location: [facility] ENDOSCOPY;  Service: Endoscopy;  Laterality: N/A;  . DILATION AND CURETTAGE OF UTERUS    . LUMBAR FUSION    . REMOVE OVARIAN CYST    . SPINAL FUSION    . TUBAL LIGATION       OB History   No obstetric history on file.     Family History  Problem Relation Age of Onset  . Breast cancer Neg Hx     Social History   Tobacco Use  . Smoking status: Former Smoker    Packs/day: 1.00    Years: 15.00    Pack years: 15.00    Quit date: 08/15/1991    Years since quitting: 29.4  . Smokeless tobacco: Never Used  Vaping Use  . Vaping Use: Never used  Substance Use Topics  . Alcohol use: Yes    Comment: 5-7 GLASSES WINE/WEEK  . Drug use: Never    Home Medications Prior to Admission medications   Medication Sig Start Date End Date Taking? Authorizing Provider  amLODipine (NORVASC) 10 MG tablet Take 10 mg by mouth daily.  02/12/14  Yes [provider]  baclofen (LIORESAL) 10 MG tablet Take 10 mg by mouth daily as needed for muscle spasms. 01/04/21  Yes [provider]  gabapentin (NEURONTIN) 300 MG capsule Take 300 mg by mouth daily as needed (pain). 07/18/20  Yes [provider]  glimepiride (AMARYL) 4 MG tablet Take 4 mg by mouth daily. 02/12/14  Yes [provider]  HUMALOG KWIKPEN 100 UNIT/ML KwikPen Inject 5 Units into the skin daily as needed (high blood sugar). Sliding scale 08/04/20  Yes [provider]  insulin detemir (LEVEMIR) 100 UNIT/ML FlexPen Inject 14 Units into the skin daily. 02/19/14  Yes [provider]  metFORMIN (GLUCOPHAGE) 1000 MG tablet Take 1,000 mg by mouth daily.  02/12/14  Yes [provider]  Multiple Vitamin (MULTIVITAMIN) capsule Take 1 capsule by mouth daily.   Yes [provider]  Omega-3 Fatty Acids (SUPER TWIN EPA/DHA) 1250 MG CAPS Take 2 capsules by mouth daily. 05/18/09  Yes [provider]  oxyCODONE-acetaminophen (PERCOCET) 5-325 MG tablet  Take 1 tablet by mouth every 8 (eight) hours as needed for severe pain. 12/03/20  Yes Johnn Hai, PA-C  PREMARIN vaginal cream Place 0.5 g vaginally 2 (two) times a week. 11/05/20  Yes [provider]  Propylene Glycol-Glycerin (SOOTHE) 0.6-0.6 % SOLN Place 1 drop into both eyes daily as needed (dry eyes).   Yes [provider]  benazepril (LOTENSIN) 40 MG tablet Take 40 mg by mouth daily.  12/04/14   [provider]  clindamycin (CLEOCIN) 150 MG capsule Take 2 capsules (300 mg total) by mouth 3 (three) times daily. May dispense as 150mg  capsules Patient not taking: No sig reported 07/07/15   Waynetta Pean, PA-C  Lactobacillus (ACIDOPHILUS PROBIOTIC) 10 MG TABS Take 10 mg by mouth 3 (three) times daily. Patient not taking: No sig reported 07/07/15   Waynetta Pean, PA-C    Allergies    Doxycycline, Levaquin [levofloxacin], Bactrim [sulfamethoxazole-trimethoprim], Ciprofloxacin, Penicillins, Prednisone, Statins, and Sulfa antibiotics  Review of Systems   Review of Systems  Constitutional: Negative for chills and fever.  HENT: Negative for ear pain and sore throat.   Eyes: Negative for pain and visual disturbance.  Respiratory: Negative for cough and shortness of breath.   Cardiovascular: Negative for chest pain and palpitations.  Gastrointestinal: Positive for abdominal pain. Negative for vomiting.  Genitourinary: Negative for dysuria and hematuria.  Musculoskeletal: Negative for arthralgias and back pain.  Skin: Negative for color change and rash.  Neurological: Negative for seizures and syncope.  All other systems reviewed and are negative.   Physical Exam Updated Vital Signs BP 139/73   Pulse 60   Temp 98 F (36.7 C) (Oral)   Resp (!) 26   SpO2 100%   Physical Exam Vitals and nursing note reviewed.  Constitutional:      General: She is not in acute distress.    Appearance: She is well-developed.  HENT:     Head: Normocephalic and  atraumatic.  Eyes:     General: No scleral icterus.    Extraocular Movements: Extraocular movements intact.     Conjunctiva/sclera: Conjunctivae normal.  Cardiovascular:     Rate and Rhythm: Normal rate and regular rhythm.     Heart sounds: No murmur heard.   Pulmonary:     Effort: Pulmonary effort is normal. No respiratory distress.     Breath sounds: Normal breath sounds.  Abdominal:     Palpations: Abdomen is soft.     Tenderness: There is no abdominal tenderness. There is no guarding.  Musculoskeletal:     Cervical back: Neck supple.  Skin:    General: Skin is  warm and dry.  Neurological:     General: No focal deficit present.     Mental Status: She is alert.     Motor: No weakness.  Psychiatric:        Mood and Affect: Mood is not depressed.        Behavior: Behavior normal.     ED Results / Procedures / Treatments   Labs (all labs ordered are listed, but only abnormal results are displayed) Labs Reviewed  CBC WITH DIFFERENTIAL/PLATELET - Abnormal; Notable for the following components:      Result Value   RBC 3.43 (*)    Hemoglobin 10.1 (*)    HCT 30.8 (*)    All other components within normal limits  COMPREHENSIVE METABOLIC PANEL - Abnormal; Notable for the following components:   Sodium 133 (*)    Glucose, Bld 254 (*)    All other components within normal limits  URINALYSIS, ROUTINE W REFLEX MICROSCOPIC - Abnormal; Notable for the following components:   Color, Urine STRAW (*)    Specific Gravity, Urine 1.003 (*)    Glucose, UA 50 (*)    All other components within normal limits  RETICULOCYTES - Abnormal; Notable for the following components:   Retic Ct Pct 3.7 (*)    RBC. 3.29 (*)    Immature Retic Fract 23.7 (*)    All other components within normal limits  HEMOGLOBIN AND HEMATOCRIT, BLOOD - Abnormal; Notable for the following components:   Hemoglobin 9.6 (*)    HCT 29.5 (*)    All other components within normal limits  RESP PANEL BY RT-PCR (FLU A&B,  COVID) ARPGX2  LIPASE, BLOOD  HEMOGLOBIN AND HEMATOCRIT, BLOOD  PROTIME-INR  PROTIME-INR  HEMOGLOBIN AND HEMATOCRIT, BLOOD  MAGNESIUM  MAGNESIUM  COMPREHENSIVE METABOLIC PANEL  CBC  HEMOGLOBIN AND HEMATOCRIT, BLOOD  FERRITIN  FOLATE  METHYLMALONIC ACID, SERUM  IRON AND TIBC  HEMOGLOBIN A1C  POC OCCULT BLOOD, ED  TYPE AND SCREEN  ABO/RH    EKG None  Radiology No results found.  Procedures Procedures   Medications Ordered in ED Medications  pantoprazole (PROTONIX) 80 mg in sodium chloride 0.9 % 100 mL (0.8 mg/mL) infusion (8 mg/hr Intravenous New Bag/Given 01/06/21 2200)  insulin aspart (novoLOG) injection 0-6 Units (has no administration in time range)  fentaNYL (SUBLIMAZE) injection 25 mcg (25 mcg Intravenous Given 01/06/21 2153)  ondansetron (ZOFRAN) injection 4 mg (has no administration in time range)  0.9 %  sodium chloride infusion ( Intravenous New Bag/Given 01/06/21 2348)  pantoprazole (PROTONIX) injection 40 mg (40 mg Intravenous Given 01/06/21 1948)    ED Course  I have reviewed the triage vital signs and the nursing notes.  Pertinent labs & imaging results that were available during my care of the patient were reviewed by me and considered in my medical decision making (see chart for details).    MDM Rules/Calculators/A&P                           Patient presents with melena.  Hemodynamically stable.  Not lightheadedness or having syncope.  Mild chronic abdominal pain with a reassuring exam and negative previous work-up.  Her hemoglobin has dropped 4 points so she is a candidate for admission to the hospital. No abd pain or tenderness; CT not emergently indicated. Protonix was ordered; there is not an indication for Rocephin or octreotide. Remainder labs reassuring. D/w hospitalist. Pt admitted for further [place].  Final Clinical Impression(s) /  ED Diagnoses Final diagnoses:  None    Rx / DC Orders ED Discharge Orders    None       Aris Lot, MD 01/06/21 2358    Pattricia Boss, MD 01/11/21 [phone]

## 2021-01-06 NOTE — ED Triage Notes (Signed)
Pt reports having right side pain for extended time, now having lower abd pain. Reports blood in stools x 6 days. Feeling weak and lightheaded.

## 2021-01-06 NOTE — ED Provider Notes (Signed)
Emergency Medicine Provider Triage Evaluation Note  Mia Todd , a 75 y.o. female  was evaluated in triage.  Pt complains of a 6 day h/o bloody stools. She is c/o pain to the left upper abdomen and left lower abdomen. States initially she had some dark stool mixed with brighter blood but now she is only have dark stools. She denies syncope but has had some lighthesdedness  She has been taking bc powder  Review of Systems  Positive: Melena, abd pain Negative: syncope  Physical Exam  BP (!) 153/72   Pulse 85   Temp 98.6 F (37 C)   Resp 16   SpO2 94%  Gen:   Awake, no distress   Resp:  Normal effort  MSK:   Moves extremities without difficulty  Other:  abd ttp to the llq and luq  Medical Decision Making  Medically screening exam initiated at 5:52 PM.  Appropriate orders placed.  Mia Todd was informed that the remainder of the evaluation will be completed by another provider, this initial triage assessment does not replace that evaluation, and the importance of remaining in the ED until their evaluation is complete.     Rodney Booze, PA-C 01/06/21 Cleda Clarks, MD 01/06/21 [phone]

## 2021-01-06 NOTE — H&P (Signed)
History and Physical    PLEASE NOTE THAT DRAGON DICTATION SOFTWARE WAS USED IN THE CONSTRUCTION OF THIS NOTE.   Mia Todd IOX:[id number] DOB: 05-18-1946 DOA: 01/06/2021  PCP: [facility] Patient coming from: home   I have personally briefly reviewed patient's old medical records in [place]  Chief Complaint: Dark-colored stool  HPI: Mia Todd is a 75 y.o. female with medical history significant for hypertension, GERD, diverticulitis, insulin-dependent type 2 diabetes mellitus, who is admitted to [facility] on 01/06/2021 with suspected acute upper gastrointestinal bleed after presenting from home to [facility] ED complaining of dark-colored stool.   The patient reports that, after taking 2 to 3 packs/day of BCs powder every day for greater than 3 weeks, that she developed melena starting 4 to 5 days ago.  In the interval, she reports 3-4 episodes of melena per day over that timeframe, without evidence of bright red blood in the stool.  She reports intermittent nausea in the absence of any associated vomiting, and specifically denies any history of hematemesis.  Not associate with any diarrhea or recent traveling.  She reports mild associated dizziness over the last 4 to 5 days, without any progression of this symptom over that timeframe, and denies any associated presyncope or syncope.  Denies any recent falls.  Denies any recent chest pain, shortness of breath, palpitations, diaphoresis.  She also denies any recent subjective fever, chills, rigors, or generalized myalgias, no recent rash.  Denies any recent dysuria, gross hematuria, or change in urinary urgency/frequency.  No recent cough or hemoptysis.  She reports that her recent use of BCs powder was prompted by new onset right-sided abdominal discomfort over the last 4 to 5 weeks.  She reports some improvement in the intensity of the associated abdominal pain with the BCs powder, and denies any acute worsening of  this pain over the last several days.,  Including no worsening following development of melena starting 4 to 5 days ago.  Denies any preceding trauma.  In addition to her use of 2 to 3 packs of BC's powder per day for greater than 3 weeks, the patient also reports that she has started taking ibuprofen 200 mg p.o. daily over that timeframe.  Aside from her recent use of BCs powder and aforementioned ibuprofen, she denies any use of additional NSAIDs, and denies any use of blood thinners as an outpatient, including no use of aspirin.  No history of prior acute gastrointestinal bleed.  She notes that she underwent 1 prior EGD, approximately 20 to 25 years ago, for evaluation of GERD, with no ensuing EGD since that time.  She reports that she is up-to-date with Toran Murch her routine screening colonoscopies, with most recent colonoscopy reportedly occurring approximately 1 year ago, which she states was not associate with any acute abnormalities.  She reports that she consumes less than 1 glass of wine per day, without any recent increase in alcohol consumption relative to this baseline frequency.  Denies any known history of underlying liver disease.  She conveys that she has never previously required blood transfusion.  Not on any supplemental potassium, supplemental iron, bisphosphonate medications, or Pepto-Bismol.  She acknowledges a history of GERD, as above, and conveys that she is not currently on any PPI or H2 blocker as an outpatient.  Denies any personal history of malignancy.   Per chart review, the patient's most recent prior hemoglobin was found to be 14.1 on 12/03/2020, with only other prior hemoglobin data point available  in the EMR at this time noted to be 15 when checked in November 2016.  In the context of the previously described 1 month of abdominal discomfort, she previously underwent CT abdomen/pelvis during the first week of May 2022, which showed no evidence of acute intra-abdominal process,  while showing diverticulosis in the absence of diverticulitis.     ED Course:  Vital signs in the ED were notable for the following: Temperature max 98.8, heart rate 74-85; blood pressure 124/72 -153/73; respiratory rate 16-17, oxygen saturation 94 to 100% on room air.  Labs were notable for the following: CMP notable for the following: Sodium 133, which corrects to approximately 136 when taking into account concomitant hyperglycemia: Bicarbonate 22, anion gap 9, BUN 11, which is unchanged from most recent prior value when checked on 12/03/2020, creatinine 0.85, glucose 254, and liver enzymes were found to be within normal limits.  Lipase 27,.  CBC notable for white blood cell count of 5900, hemoglobin 10.1 relative to most recent prior value of 14.1 on 12/03/2020, with presenting hemoglobin associated with normocytic/normochromic findings as well as nonelevated RDW, and platelets 359.  Type and screen was performed in the ED this evening.  Nasopharyngeal COVID-19/influenza PCR performed in the ED this evening and found to be negative.  While in the ED, the following were administered: Protonix 40 mg IV x1.  Subsequently with the patient was admitted to the PCU for further evaluation and management of suspected presenting acute upper gastrointestinal bleed.      Review of Systems: As per HPI otherwise 10 point review of systems negative.   Past Medical History:  Diagnosis Date  . Chronic kidney disease   . DDD (degenerative disc disease), lumbar   . Diabetes mellitus without complication ([place])   . Diverticulitis   . GERD (gastroesophageal reflux disease)   . Hyperlipidemia   . Hypertension   . Recurrent UTI     Past Surgical History:  Procedure Laterality Date  . BACK SURGERY    . BREAST CYST ASPIRATION Left 1974   neg  . COLONOSCOPY WITH PROPOFOL N/A 03/05/2018   Procedure: COLONOSCOPY WITH PROPOFOL;  Surgeon: Lollie Sails, MD;  Location: [facility] ENDOSCOPY;  Service: Endoscopy;   Laterality: N/A;  . DILATION AND CURETTAGE OF UTERUS    . LUMBAR FUSION    . REMOVE OVARIAN CYST    . SPINAL FUSION    . TUBAL LIGATION      Social History:  reports that she quit smoking about 29 years ago. She has a 15.00 pack-year smoking history. She has never used smokeless tobacco. She reports current alcohol use. She reports that she does not use drugs.   Allergies  Allergen Reactions  . Doxycycline Shortness Of Breath    Difficulty taking deep breaths  . Levaquin [Levofloxacin] Shortness Of Breath  . Bactrim [Sulfamethoxazole-Trimethoprim]     She does not remember what her reaction is but she knows she is allergic to it.  . Ciprofloxacin Other (See Comments)  . Penicillins Swelling    Joint swelling  . Prednisone     Other reaction(s): Headache, Other (See Comments) Uncontrollable blood sugar - states she did not have this reaction from cortisone injections  . Statins Other (See Comments)  . Sulfa Antibiotics Other (See Comments)    Family History  Problem Relation Age of Onset  . Breast cancer Neg Hx      Prior to Admission medications   Medication Sig Start Date End Date Taking? Authorizing Provider  amLODipine (NORVASC) 10 MG tablet Take 10 mg by mouth daily.  02/12/14  Yes [provider]  baclofen (LIORESAL) 10 MG tablet Take 10 mg by mouth daily as needed for muscle spasms. 01/04/21  Yes [provider]  gabapentin (NEURONTIN) 300 MG capsule Take 300 mg by mouth daily as needed (pain). 07/18/20  Yes [provider]  predniSONE (STERAPRED UNI-PAK 21 TAB) 10 MG (21) TBPK tablet 6 pills by mouth on day 1, and decrease by one pill every day for total of 6 days. 05/19/20  Yes [provider]  benazepril (LOTENSIN) 40 MG tablet Take 40 mg by mouth daily.  12/04/14   [provider]  clindamycin (CLEOCIN) 150 MG capsule Take 2 capsules (300 mg total) by mouth 3 (three) times daily. May dispense as 150mg  capsules Patient not  taking: No sig reported 07/07/15   Waynetta Pean, PA-C  glimepiride (AMARYL) 4 MG tablet Take 4 mg by mouth 2 (two) times daily.  02/12/14   [provider]  HUMALOG KWIKPEN 100 UNIT/ML KwikPen Inject 3 Units into the skin 3 (three) times daily. 08/04/20   [provider]  Insulin Detemir (LEVEMIR FLEXPEN) 100 UNIT/ML Pen Inject 7 Units into the skin daily. 02/19/14   [provider]  Lactobacillus (ACIDOPHILUS PROBIOTIC) 10 MG TABS Take 10 mg by mouth 3 (three) times daily. 07/07/15   Waynetta Pean, PA-C  metFORMIN (GLUCOPHAGE) 1000 MG tablet Take 1,000 mg by mouth daily.  02/12/14   [provider]  Multiple Vitamin (MULTIVITAMIN) capsule Take 1 capsule by mouth daily.    [provider]  NOVOLOG FLEXPEN 100 UNIT/ML FlexPen Inject into the skin. 08/27/20   [provider]  Omega-3 Fatty Acids (SUPER TWIN EPA/DHA) 1250 MG CAPS Take 2 capsules by mouth daily. 05/18/09   [provider]  oxyCODONE-acetaminophen (PERCOCET) 5-325 MG tablet Take 1 tablet by mouth every 8 (eight) hours as needed for severe pain. 12/03/20   Johnn Hai, PA-C  PREMARIN vaginal cream Place 0.5 g vaginally 2 (two) times a week. 11/05/20   [provider]  traMADol (ULTRAM) 50 MG tablet Take by mouth.    [provider]     Objective    Physical Exam: Vitals:   01/06/21 1742 01/06/21 1915 01/06/21 1930  BP: (!) 153/72 124/72 (!) 155/93  Pulse: 85 74 77  Resp: 16 17   Temp: 98.6 F (37 C)    SpO2: 94% 100% 100%    General: appears to be stated age; alert, oriented Skin: warm, dry, no rash Head:  AT/[place] Mouth:  Oral mucosa membranes appear dry, normal dentition Neck: supple; trachea midline Heart:  RRR; did not appreciate any M/R/G Lungs: CTAB, did not appreciate any wheezes, rales, or rhonchi Abdomen: + BS; soft, ND; mild tenderness to palpation over the right upper quadrant and right lower quadrant in the absence of any  associated guarding, rigidity, or rebound tenderness. Vascular: 2+ pedal pulses b/l; 2+ radial pulses b/l Extremities: no peripheral edema, no muscle wasting Neuro: strength and sensation intact in upper and lower extremities b/l    Labs on Admission: I have personally reviewed following labs and imaging studies  CBC: Recent Labs  Lab 01/06/21 1758  WBC 5.9  NEUTROABS 3.9  HGB 10.1*  HCT 30.8*  MCV 89.8  PLT 333   Basic Metabolic Panel: Recent Labs  Lab 01/06/21 1758  NA 133*  K 4.3  CL 102  CO2 22  GLUCOSE 254*  BUN  11  CREATININE 0.85  CALCIUM 9.8   GFR: CrCl cannot be calculated (Unknown ideal weight.). Liver Function Tests: Recent Labs  Lab 01/06/21 1758  AST 17  ALT 15  ALKPHOS 50  BILITOT 0.7  PROT 7.0  ALBUMIN 4.0   Recent Labs  Lab 01/06/21 1758  LIPASE 27   No results for input(s): AMMONIA in the last 168 hours. Coagulation Profile: No results for input(s): INR, PROTIME in the last 168 hours. Cardiac Enzymes: No results for input(s): CKTOTAL, CKMB, CKMBINDEX, TROPONINI in the last 168 hours. BNP (last 3 results) No results for input(s): PROBNP in the last 8760 hours. HbA1C: No results for input(s): HGBA1C in the last 72 hours. CBG: No results for input(s): GLUCAP in the last 168 hours. Lipid Profile: No results for input(s): CHOL, HDL, LDLCALC, TRIG, CHOLHDL, LDLDIRECT in the last 72 hours. Thyroid Function Tests: No results for input(s): TSH, T4TOTAL, FREET4, T3FREE, THYROIDAB in the last 72 hours. Anemia Panel: No results for input(s): VITAMINB12, FOLATE, FERRITIN, TIBC, IRON, RETICCTPCT in the last 72 hours. Urine analysis:    Component Value Date/Time   COLORURINE STRAW (A) 01/06/2021 1758   APPEARANCEUR CLEAR 01/06/2021 1758   APPEARANCEUR Hazy 10/19/2013 1721   LABSPEC 1.003 (L) 01/06/2021 1758   LABSPEC 1.014 10/19/2013 1721   PHURINE 6.0 01/06/2021 1758   GLUCOSEU 50 (A) 01/06/2021 1758   GLUCOSEU 50 mg/dL 10/19/2013 1721    HGBUR NEGATIVE 01/06/2021 1758   BILIRUBINUR NEGATIVE 01/06/2021 1758   BILIRUBINUR Negative 10/19/2013 1721   KETONESUR NEGATIVE 01/06/2021 1758   PROTEINUR NEGATIVE 01/06/2021 1758   NITRITE NEGATIVE 01/06/2021 1758   LEUKOCYTESUR NEGATIVE 01/06/2021 1758   LEUKOCYTESUR 1+ 10/19/2013 1721    Radiological Exams on Admission: No results found.   Assessment/Plan   Mia Todd is a 75 y.o. female with medical history significant for hypertension, GERD, diverticulitis, insulin-dependent type 2 diabetes mellitus, who is admitted to [facility] on 01/06/2021 with suspected acute upper gastrointestinal bleed after presenting from home to [facility] ED complaining of dark-colored stool.    Principal Problem:   Acute upper GI bleed Active Problems:   Acute blood loss anemia   Hypertension   Diabetes mellitus without complication (HCC)   GERD (gastroesophageal reflux disease)     #) Acute Upper GI Bleed: diagnosis on the basis of 4 to 5 days of new onset melena, corresponding to evidence of acute blood loss anemia for this evening's labs are relative to corresponding hemoglobin level when checked on 12/01/2020, as further quantified below.  Development of melena occurred after starting to take 2 to 3 packs of BCs powder per day every day for greater than 3 weeks in addition to initiation of daily ibuprofen use over that timeframe.  Aside from these medications, she denies any additional NSAID or blood thinner use as an outpatient, including no additional aspirin.  No known history of underlying liver disease, and reports consuming less than 1 glass of wine per day, without any recent increase in consumption relative to this baseline volume.  Has a history of GERD, prompting EGD around 20 to 25 years ago, without any subsequent EGD evaluation.  Not currently on PPI or H2 blocker as an outpatient.  In the absence of known liver disease, initiation of SBP prophylaxis does not appear to be  warranted at this time.  Presentation and history are less suggestive of variceal bleed, and therefore there does not appear to be any indication for octreotide.  At this time,  differential broadly includes, particularly in the setting of recent use of BCs powder as well as new daily use of ibuprofen, gastritis versus peptic ulcer disease vs esophagitis versus Dieulafoy lesion.  At this time, the patient appears hemodynamically stable, with normotensive blood pressures in the absence of any associated tachycardia, while noting that she has not on any beta-blocker as an outpatient. Presentation appears to be associated with acute blood loss anemia, as further described below.  She notes mild dizziness over the last 4 to 5 days, without any further progression of this symptom over the last 1 to 2 days.  Otherwise, she is currently asymptomatic, including no recent chest pain, shortness of breath, palpitations, diaphoresis, or syncope.  Presentation has not been associate with any hematemesis.  Of note, the patient was typed and screened in the ED today.  Given suspected upper gastrointestinal source, will initiate Protonix drip.     Plan: NPO. Refraining from pharmacologic DVT prophylaxis. Monitor on telemetry. Monitor continuous pulse-ox. Maintain at least 2 large bore IV's. Check INR in the AM. Q4H H&H's have been ordered through 9 AM on 01/07/21, with STAT repeat h&H now . Will closely monitor these ensuing Hgb levels and correlate these data points with the patient's overall clinical picture including vital signs to determine need for subsequent transfusion.  Protonix drip.  Gentle IV fluids.  Hold home ibuprofen and BC powder.  Counseled the patient on the importance of reduction in the use of BC powder as an outpatient.  Add on iron studies to pretransfusion specimen.  Repeat check BMP in the morning, including with attention to interval BUN level. Will consult GI.  Add on INR, with repeat INR to be checked  in the morning      #) Acute blood loss anemia: in the setting of suspected acute upper GI bleed, presenting Hgb noted to be 10.1, which is relative to most recent prior Hgb value of 14.1 on 12/04/2018. At this time, patient appears hemodynamically stable and asymptomatic, as further described above.  Presenting hemoglobin is associated with normocytic/normochromic findings as well as nonelevated RDW.  Type and screen completed in the ED today.    Plan: work-up and management for presenting suspected acute upper GI bleed, as above, including close monitoring of Q4H H&H's, with clinical evaluation for determination of need for blood transfusion, as further described above. Monitor on telemetry. Monitor continuous pulse-ox. NPO. Refraining from pharmacologic DVT prophylaxis. Check INR now and again in the morning.  Add on the following to initial lab specimen collected in the ED today: total iron, TIBC, ferritin, MMA, folic acid level, reticulocyte count. GI consult, as above.  Check EKG.       #) Essential hypertension: Documented history of such, on Norvasc and benazepril as her outpatient antihypertensive medications.  Systolic blood pressures in the ED today have been in the 120s to 150s mmHg, without any evidence of hypotension thus far.  We will hold home antihypertensive medications for now, and closely monitor ensuing blood pressure in the setting of suspected presenting acute upper gastrointestinal bleed, as further detailed above.  Plan: Hold home benazepril and Norvasc for now, as above.  Close monitoring of ensuing blood pressure via routine vital signs.      #) GERD: Documented history of such, with the patient confirming this diagnosis, for which she reports undergoing a one-time EGD approximately 20 to 25 years ago.  She is not currently on a PPI or H2 blocker at home.  Protonix drip initiated  this evening in the setting of suspected presenting acute upper gastrointestinal bleed,  as further detailed above  Plan: We will monitor for results of ensuing GI consult, including potential EGD, with consideration for initiation of H2 blocker as an outpatient.      #) Type 2 diabetes mellitus: On Levemir 7 units subcu daily daily as well as Humalog 3 units 3 times daily with meals as an outpatient.  Additionally, she is on glimepiride and metformin at home.  Presenting blood sugar noted to be 254 without evidence of anion gap metabolic acidosis.  Given the patient's report of mild abdominal discomfort over the last 4 to 5 weeks, with presenting hyperglycemia, will check hemoglobin A1c at this time to assess for degree of outpatient glycemic control, given potential for gastroparesis in the setting.  Plan: Check hemoglobin A1c, as above.  Hold home scheduled basal insulin and scheduled Humalog.  We will also hold home oral hypoglycemic agents during this hospitalization.  Accu-Cheks before every meal and at bedtime with low-dose sliding scale insulin.      DVT prophylaxis: SCDs Code Status: Full code Family Communication: The patient's case was discussed with her sister, who is present at bedside. Disposition Plan: Per Rounding Team Consults called: none  Admission status: Observation; PCU.     Of note, this patient was added by me to the following Admit List/Treatment Team: [email or web address].      PLEASE NOTE THAT DRAGON DICTATION SOFTWARE WAS USED IN THE CONSTRUCTION OF THIS NOTE.   [facility] Pager [phone] From [place]  Otherwise, please contact night-coverage  [email or web address] Password [email or web address]   01/06/2021, 9:10 PM

## 2021-01-06 NOTE — ED Notes (Signed)
Attempted report x1. 

## 2021-01-07 ENCOUNTER — Encounter (HOSPITAL_COMMUNITY)
Admission: EM | Disposition: A | Discharge status: Home / Self Care | Payer: Self-pay | Source: Home / Self Care | Attending: Family Medicine

## 2021-01-07 ENCOUNTER — Observation Stay (HOSPITAL_COMMUNITY): Payer: Medicare Other | Admitting: Certified Registered"

## 2021-01-07 ENCOUNTER — Encounter (HOSPITAL_COMMUNITY): Payer: Self-pay | Admitting: Internal Medicine

## 2021-01-07 DIAGNOSIS — K5730 Diverticulosis of large intestine without perforation or abscess without bleeding: Secondary | ICD-10-CM | POA: Diagnosis present

## 2021-01-07 DIAGNOSIS — K259 Gastric ulcer, unspecified as acute or chronic, without hemorrhage or perforation: Secondary | ICD-10-CM

## 2021-01-07 DIAGNOSIS — K921 Melena: Secondary | ICD-10-CM

## 2021-01-07 DIAGNOSIS — T39395A Adverse effect of other nonsteroidal anti-inflammatory drugs [NSAID], initial encounter: Secondary | ICD-10-CM | POA: Diagnosis not present

## 2021-01-07 DIAGNOSIS — D62 Acute posthemorrhagic anemia: Secondary | ICD-10-CM

## 2021-01-07 DIAGNOSIS — Z87891 Personal history of nicotine dependence: Secondary | ICD-10-CM | POA: Diagnosis not present

## 2021-01-07 DIAGNOSIS — Z888 Allergy status to other drugs, medicaments and biological substances status: Secondary | ICD-10-CM | POA: Diagnosis not present

## 2021-01-07 DIAGNOSIS — Z881 Allergy status to other antibiotic agents status: Secondary | ICD-10-CM | POA: Diagnosis not present

## 2021-01-07 DIAGNOSIS — K219 Gastro-esophageal reflux disease without esophagitis: Secondary | ICD-10-CM

## 2021-01-07 DIAGNOSIS — K254 Chronic or unspecified gastric ulcer with hemorrhage: Secondary | ICD-10-CM | POA: Diagnosis not present

## 2021-01-07 DIAGNOSIS — Z7982 Long term (current) use of aspirin: Secondary | ICD-10-CM | POA: Diagnosis not present

## 2021-01-07 DIAGNOSIS — E119 Type 2 diabetes mellitus without complications: Secondary | ICD-10-CM | POA: Diagnosis not present

## 2021-01-07 DIAGNOSIS — Z20822 Contact with and (suspected) exposure to covid-19: Secondary | ICD-10-CM | POA: Diagnosis not present

## 2021-01-07 DIAGNOSIS — Z981 Arthrodesis status: Secondary | ICD-10-CM | POA: Diagnosis not present

## 2021-01-07 DIAGNOSIS — Z880 Allergy status to penicillin: Secondary | ICD-10-CM | POA: Diagnosis not present

## 2021-01-07 DIAGNOSIS — Z79899 Other long term (current) drug therapy: Secondary | ICD-10-CM | POA: Diagnosis not present

## 2021-01-07 DIAGNOSIS — K922 Gastrointestinal hemorrhage, unspecified: Secondary | ICD-10-CM | POA: Diagnosis present

## 2021-01-07 DIAGNOSIS — Z882 Allergy status to sulfonamides status: Secondary | ICD-10-CM | POA: Diagnosis not present

## 2021-01-07 DIAGNOSIS — I10 Essential (primary) hypertension: Secondary | ICD-10-CM

## 2021-01-07 DIAGNOSIS — Z794 Long term (current) use of insulin: Secondary | ICD-10-CM | POA: Diagnosis not present

## 2021-01-07 DIAGNOSIS — E1122 Type 2 diabetes mellitus with diabetic chronic kidney disease: Secondary | ICD-10-CM | POA: Diagnosis not present

## 2021-01-07 DIAGNOSIS — E1165 Type 2 diabetes mellitus with hyperglycemia: Secondary | ICD-10-CM | POA: Diagnosis not present

## 2021-01-07 DIAGNOSIS — Z7984 Long term (current) use of oral hypoglycemic drugs: Secondary | ICD-10-CM | POA: Diagnosis not present

## 2021-01-07 DIAGNOSIS — N189 Chronic kidney disease, unspecified: Secondary | ICD-10-CM | POA: Diagnosis not present

## 2021-01-07 DIAGNOSIS — I129 Hypertensive chronic kidney disease with stage 1 through stage 4 chronic kidney disease, or unspecified chronic kidney disease: Secondary | ICD-10-CM | POA: Diagnosis not present

## 2021-01-07 DIAGNOSIS — E785 Hyperlipidemia, unspecified: Secondary | ICD-10-CM | POA: Diagnosis not present

## 2021-01-07 HISTORY — PX: ESOPHAGOGASTRODUODENOSCOPY (EGD) WITH PROPOFOL: SHX5813

## 2021-01-07 HISTORY — PX: BIOPSY: SHX5522

## 2021-01-07 LAB — FERRITIN: Ferritin: 22 ng/mL (ref 11–307)

## 2021-01-07 LAB — MAGNESIUM
Magnesium: 2.2 mg/dL (ref 1.7–2.4)
Magnesium: 2.2 mg/dL (ref 1.7–2.4)

## 2021-01-07 LAB — CBC
HCT: 29.2 % — ABNORMAL LOW (ref 36.0–46.0)
Hemoglobin: 9.8 g/dL — ABNORMAL LOW (ref 12.0–15.0)
MCH: 29.1 pg (ref 26.0–34.0)
MCHC: 33.6 g/dL (ref 30.0–36.0)
MCV: 86.6 fL (ref 80.0–100.0)
Platelets: 359 10*3/uL (ref 150–400)
RBC: 3.37 MIL/uL — ABNORMAL LOW (ref 3.87–5.11)
RDW: 14.5 % (ref 11.5–15.5)
WBC: 5.4 10*3/uL (ref 4.0–10.5)
nRBC: 0 % (ref 0.0–0.2)

## 2021-01-07 LAB — GLUCOSE, CAPILLARY: Glucose-Capillary: 133 mg/dL — ABNORMAL HIGH (ref 70–99)

## 2021-01-07 LAB — COMPREHENSIVE METABOLIC PANEL
ALT: 15 U/L (ref 0–44)
AST: 15 U/L (ref 15–41)
Albumin: 3.7 g/dL (ref 3.5–5.0)
Alkaline Phosphatase: 46 U/L (ref 38–126)
Anion gap: 10 (ref 5–15)
BUN: 8 mg/dL (ref 8–23)
CO2: 22 mmol/L (ref 22–32)
Calcium: 9.9 mg/dL (ref 8.9–10.3)
Chloride: 106 mmol/L (ref 98–111)
Creatinine, Ser: 0.77 mg/dL (ref 0.44–1.00)
GFR, Estimated: >60 mL/min (ref >60)
Glucose, Bld: 133 mg/dL — ABNORMAL HIGH (ref 70–99)
Potassium: 3.7 mmol/L (ref 3.5–5.1)
Sodium: 138 mmol/L (ref 135–145)
Total Bilirubin: 0.6 mg/dL (ref 0.3–1.2)
Total Protein: 6.8 g/dL (ref 6.5–8.1)

## 2021-01-07 LAB — IRON AND TIBC
Iron: 31 ug/dL (ref 28–170)
Saturation Ratios: 9 % — ABNORMAL LOW (ref 10.4–31.8)
TIBC: 357 ug/dL (ref 250–450)
UIBC: 326 ug/dL

## 2021-01-07 LAB — HEMOGLOBIN A1C
Hgb A1c MFr Bld: 7.4 % — ABNORMAL HIGH (ref 4.8–5.6)
Mean Plasma Glucose: 166 mg/dL

## 2021-01-07 LAB — PROTIME-INR
INR: 0.9 (ref 0.8–1.2)
Prothrombin Time: 12.6 seconds (ref 11.4–15.2)

## 2021-01-07 LAB — HEMOGLOBIN AND HEMATOCRIT, BLOOD
HCT: 31.2 % — ABNORMAL LOW (ref 36.0–46.0)
Hemoglobin: 10.2 g/dL — ABNORMAL LOW (ref 12.0–15.0)

## 2021-01-07 LAB — MRSA PCR SCREENING: MRSA by PCR: NEGATIVE

## 2021-01-07 LAB — FOLATE: Folate: 14.5 ng/mL (ref >5.9)

## 2021-01-07 LAB — ABO/RH: ABO/RH(D): A POS

## 2021-01-07 SURGERY — ESOPHAGOGASTRODUODENOSCOPY (EGD) WITH PROPOFOL
Anesthesia: Monitor Anesthesia Care

## 2021-01-07 MED ORDER — METOCLOPRAMIDE HCL 5 MG/ML IJ SOLN: 10.0000 mg | Freq: Once | INTRAMUSCULAR | Status: DC

## 2021-01-07 MED ORDER — PROPOFOL 10 MG/ML IV BOLUS
INTRAVENOUS | Status: DC | PRN
  Administered 2021-01-07 (×2): 50 mg via INTRAVENOUS

## 2021-01-07 MED ORDER — PEG-KCL-NACL-NASULF-NA ASC-C 100 G PO SOLR
0.5000 | Freq: Once | ORAL | Status: AC
  Administered 2021-01-07: 100 g via ORAL
  Filled 2021-01-07: qty 1

## 2021-01-07 MED ORDER — SODIUM CHLORIDE 0.9 % IV SOLN: INTRAVENOUS | Status: DC | PRN

## 2021-01-07 MED ORDER — PEG-KCL-NACL-NASULF-NA ASC-C 100 G PO SOLR: 1.0000 | Freq: Once | ORAL | Status: DC

## 2021-01-07 MED ORDER — PEG-KCL-NACL-NASULF-NA ASC-C 100 G PO SOLR
0.5000 | Freq: Once | ORAL | Status: DC
  Filled 2021-01-07: qty 1

## 2021-01-07 MED ORDER — DIPHENHYDRAMINE HCL 50 MG/ML IJ SOLN
25.0000 mg | Freq: Once | INTRAMUSCULAR | Status: AC
  Administered 2021-01-07: 25 mg via INTRAVENOUS
  Filled 2021-01-07: qty 1

## 2021-01-07 MED ORDER — METOCLOPRAMIDE HCL 5 MG/ML IJ SOLN
10.0000 mg | Freq: Once | INTRAMUSCULAR | Status: AC
  Administered 2021-01-07: 10 mg via INTRAVENOUS
  Filled 2021-01-07: qty 2

## 2021-01-07 MED ORDER — PROPOFOL 500 MG/50ML IV EMUL
INTRAVENOUS | Status: DC | PRN
  Administered 2021-01-07: 125 ug/kg/min via INTRAVENOUS

## 2021-01-07 MED ORDER — BISACODYL 5 MG PO TBEC
20.0000 mg | DELAYED_RELEASE_TABLET | Freq: Once | ORAL | Status: AC
  Administered 2021-01-07: 20 mg via ORAL
  Filled 2021-01-07: qty 4

## 2021-01-07 SURGICAL SUPPLY — 15 items

## 2021-01-07 NOTE — Transfer of Care (Signed)
Immediate Anesthesia Transfer of Care Note  Patient: Mia Todd  Procedure(s) Performed: ESOPHAGOGASTRODUODENOSCOPY (EGD) WITH PROPOFOL (N/A ) BIOPSY  Patient Location: Endoscopy Unit  Anesthesia Type:MAC  Level of Consciousness: drowsy and patient cooperative  Airway & Oxygen Therapy: Patient Spontanous Breathing  Post-op Assessment: Report given to RN and Post -op Vital signs reviewed and stable  Post vital signs: Reviewed and stable  Last Vitals:  Vitals Value Taken Time  BP    Temp    Pulse    Resp    SpO2      Last Pain:  Vitals:   01/07/21 1334  TempSrc: Oral  PainSc:          Complications: No complications documented.

## 2021-01-07 NOTE — Progress Notes (Signed)
PROGRESS NOTE  Mia Todd  ZDG:[id number] DOB: Apr 17, 1946 DOA: 01/06/2021 PCP: [facility]   Brief Narrative: Mia Todd is a 75 y.o. female with a history of IDT2DM, HTN, GERD, diverticulosis who presented to the ED 5/26 with blood in stool followed by loose dark tarry stools over the past 4-5 days associated with nausea, no vomiting, and dizziness. She was recently evaluated for right abdominal/rib pain felt to be musculoskeletal, treated with BC powder and ibuprofen over the past few weeks. Hgb was found to be 10.1g/dl from prior baseline of 14-15. Hemodynamically stable. PPI gtt started and patient admitted with serial blood counts showing stability, no bleeding since arrival. GI is consulted and pt is NPO.  Assessment & Plan: Principal Problem:   Acute upper GI bleed Active Problems:   Acute blood loss anemia   Hypertension   Diabetes mellitus without complication (HCC)   GERD (gastroesophageal reflux disease)  Symptomatic acute blood loss anemia due to GI bleed: No evidence of coagulopathy or alternative sites of bleeding. - Hgb stable, not at transfusion threshold. Anemia panel pending.  GI bleeding, suspected upper GI source, possible NSAID-induced PUD given recent history: Last EGD (hx GERD, not on medications) in [place] was ~2006. Colonoscopy by Dr. Donnella Sham at [facility] 03/05/2018 with sessile polyp and diverticulosis. - NPO - PPI gtt - GI consulted - Will space out serial hgb checks for now. T&S performed though trend is stable and bleeding not ongoing. Confirmed pt would accept blood products if indicated.   Right abdominal pain: Largely negative CT abd/pelvis previously though this was without contrast. LFTs all normal. Exam reassuring, if anything most consistent with MSK etiology.  - Tylenol prn  - Will defer further work up to PCP. No red flags on exxam at this time.   IDT2DM: - Continue SSI with plans to restart levemirdaily if CBGs rise. Holding  metformin, amaryl.  - Check HbA1c.  HTN:  - Holding benazepril, norvasc for now with GI bleeding. 131/65 currently.  DVT prophylaxis: SCDs Code Status: Full Family Communication: None Disposition Plan:  Status is: Observation  The patient will require care spanning > 2 midnights and should be moved to inpatient because: Ongoing diagnostic testing needed not appropriate for outpatient work up  Dispo: The patient is from: Home              Anticipated d/c is to: Home              Patient currently is not medically stable to d/c.   Difficult to place patient No  Consultants:   [facility]  Procedures:   TBD  Antimicrobials:  None   Subjective: No bleeding since yesterday ~10:30am which was dark but less dark/tarry than priors and with some wiping red. No abd pain currently. Very hungry, described upper abdominal hunger pangs. No vomiting or other bleeding.   Objective: Vitals:   01/07/21 0200 01/07/21 0500 01/07/21 0508 01/07/21 0740  BP:   (!) 146/69 131/65  Pulse: 69  77 65  Resp: 15  15 13   Temp:   98.1 F (36.7 C) 97.7 F (36.5 C)  TempSrc:   Oral Oral  SpO2: 99%  100% 100%  Weight:  57.8 kg    Height:        Intake/Output Summary (Last 24 hours) at 01/07/2021 0956 Last data filed at 01/07/2021 0300 Gross per 24 hour  Intake 208.95 ml  Output --  Net 208.95 ml   Filed Weights   01/07/21 0030  01/07/21 0500  Weight: 57.6 kg 57.8 kg    Gen: 75 y.o. female in no distress Pulm: Non-labored breathing room air. Clear to auscultation bilaterally.  CV: Regular rate and rhythm. No murmur, rub, or gallop. No JVD, no pedal edema. GI: Abdomen soft, with equivocal mild tenderness at right anterolateral inferiod border of rib cage, also mild soreness described with epigastric palpation, otherwise non-tender, non-distended, with normoactive bowel sounds. No organomegaly or masses felt. Ext: Warm, no deformities Skin: No rashes, lesions or ulcers Neuro: Alert and  oriented. No focal neurological deficits. Psych: Judgement and insight appear normal. Mood & affect appropriate.   Data Reviewed: I have personally reviewed following labs and imaging studies  CBC: Recent Labs  Lab 01/06/21 1758 01/06/21 2123 01/07/21 0014 01/07/21 0758  WBC 5.9  --  5.4  --   NEUTROABS 3.9  --   --   --   HGB 10.1* 9.6* 9.8* 10.2*  HCT 30.8* 29.5* 29.2* 31.2*  MCV 89.8  --  86.6  --   PLT 359  --  359  --    Basic Metabolic Panel: Recent Labs  Lab 01/06/21 1758 01/07/21 0014  NA 133* 138  K 4.3 3.7  CL 102 106  CO2 22 22  GLUCOSE 254* 133*  BUN 11 8  CREATININE 0.85 0.77  CALCIUM 9.8 9.9  MG  --  2.2  2.2   GFR: Estimated Creatinine Clearance: 56.4 mL/min (by C-G formula based on SCr of 0.77 mg/dL). Liver Function Tests: Recent Labs  Lab 01/06/21 1758 01/07/21 0014  AST 17 15  ALT 15 15  ALKPHOS 50 46  BILITOT 0.7 0.6  PROT 7.0 6.8  ALBUMIN 4.0 3.7   Recent Labs  Lab 01/06/21 1758  LIPASE 27   No results for input(s): AMMONIA in the last 168 hours. Coagulation Profile: Recent Labs  Lab 01/07/21 0014  INR 0.9   Cardiac Enzymes: No results for input(s): CKTOTAL, CKMB, CKMBINDEX, TROPONINI in the last 168 hours. BNP (last 3 results) No results for input(s): PROBNP in the last 8760 hours. HbA1C: No results for input(s): HGBA1C in the last 72 hours. CBG: No results for input(s): GLUCAP in the last 168 hours. Lipid Profile: No results for input(s): CHOL, HDL, LDLCALC, TRIG, CHOLHDL, LDLDIRECT in the last 72 hours. Thyroid Function Tests: No results for input(s): TSH, T4TOTAL, FREET4, T3FREE, THYROIDAB in the last 72 hours. Anemia Panel: Recent Labs    01/06/21 2123 01/07/21 0014  FOLATE  --  14.5  FERRITIN  --  22  TIBC  --  357  IRON  --  31  RETICCTPCT 3.7*  --    Urine analysis:    Component Value Date/Time   COLORURINE STRAW (A) 01/06/2021 1758   APPEARANCEUR CLEAR 01/06/2021 1758   APPEARANCEUR Hazy 10/19/2013  1721   LABSPEC 1.003 (L) 01/06/2021 1758   LABSPEC 1.014 10/19/2013 1721   PHURINE 6.0 01/06/2021 1758   GLUCOSEU 50 (A) 01/06/2021 1758   GLUCOSEU 50 mg/dL 10/19/2013 1721   HGBUR NEGATIVE 01/06/2021 1758   BILIRUBINUR NEGATIVE 01/06/2021 1758   BILIRUBINUR Negative 10/19/2013 1721   KETONESUR NEGATIVE 01/06/2021 1758   PROTEINUR NEGATIVE 01/06/2021 1758   NITRITE NEGATIVE 01/06/2021 1758   LEUKOCYTESUR NEGATIVE 01/06/2021 1758   LEUKOCYTESUR 1+ 10/19/2013 1721   Recent Results (from the past 240 hour(s))  Resp Panel by RT-PCR (Flu A&B, Covid) Nasopharyngeal Swab     Status: None   Collection Time: 01/06/21  7:50 PM  Specimen: Nasopharyngeal Swab; Nasopharyngeal(NP) swabs in vial transport medium  Result Value Ref Range Status   SARS Coronavirus 2 by RT PCR NEGATIVE NEGATIVE Final    Comment: (NOTE) SARS-CoV-2 target nucleic acids are NOT DETECTED.  The SARS-CoV-2 RNA is generally detectable in upper respiratory specimens during the acute phase of infection. The lowest concentration of SARS-CoV-2 viral copies this assay can detect is 138 copies/mL. A negative result does not preclude SARS-Cov-2 infection and should not be used as the sole basis for treatment or other patient management decisions. A negative result may occur with  improper specimen collection/handling, submission of specimen other than nasopharyngeal swab, presence of viral mutation(s) within the areas targeted by this assay, and inadequate number of viral copies(<138 copies/mL). A negative result must be combined with clinical observations, patient history, and epidemiological information. The expected result is Negative.  Fact Sheet for Patients:  EntrepreneurPulse.com.au  Fact Sheet for Healthcare Providers:  IncredibleEmployment.be  This test is no t yet approved or cleared by the [place] FDA and  has been authorized for detection and/or diagnosis of SARS-CoV-2  by FDA under an Emergency Use Authorization (EUA). This EUA will remain  in effect (meaning this test can be used) for the duration of the COVID-19 declaration under Section 564(b)(1) of the Act, 21 U.S.C.section 360bbb-3(b)(1), unless the authorization is terminated  or revoked sooner.       Influenza A by PCR NEGATIVE NEGATIVE Final   Influenza B by PCR NEGATIVE NEGATIVE Final    Comment: (NOTE) The Xpert Xpress SARS-CoV-2/FLU/RSV plus assay is intended as an aid in the diagnosis of influenza from Nasopharyngeal swab specimens and should not be used as a sole basis for treatment. Nasal washings and aspirates are unacceptable for Xpert Xpress SARS-CoV-2/FLU/RSV testing.  Fact Sheet for Patients: EntrepreneurPulse.com.au  Fact Sheet for Healthcare Providers: IncredibleEmployment.be  This test is not yet approved or cleared by the [place] FDA and has been authorized for detection and/or diagnosis of SARS-CoV-2 by FDA under an Emergency Use Authorization (EUA). This EUA will remain in effect (meaning this test can be used) for the duration of the COVID-19 declaration under Section 564(b)(1) of the Act, 21 U.S.C. section 360bbb-3(b)(1), unless the authorization is terminated or revoked.  Performed at [facility], [place] [address] [place], [place] [address]   MRSA PCR Screening     Status: None   Collection Time: 01/07/21 12:20 AM   Specimen: Nasal Mucosa; Nasopharyngeal  Result Value Ref Range Status   MRSA by PCR NEGATIVE NEGATIVE Final    Comment:        The GeneXpert MRSA Assay (FDA approved for NASAL specimens only), is one component of a comprehensive MRSA colonization surveillance program. It is not intended to diagnose MRSA infection nor to guide or monitor treatment for MRSA infections. Performed at [facility], [place] [address], [place], [place] [address]       Radiology Studies: No results  found.  Scheduled Meds: . insulin aspart  0-6 Units Subcutaneous Q6H   Continuous Infusions: . sodium chloride 50 mL/hr at 01/06/21 2348  . pantoprozole (PROTONIX) infusion 8 mg/hr (01/07/21 0712)     LOS: 0 days   Time spent: 35 minutes.  Patrecia Pour, MD [facility] [email or web address] 01/07/2021, 9:56 AM

## 2021-01-07 NOTE — Consult Note (Addendum)
                                                                           [facility] Consult: 9:50 AM 01/07/2021  LOS: 0 days    Referring Provider: Dr Grunz Primary Care Physician:  [place] Primary Gastroenterologist:  Dr. Martin Skulskie.    Reason for Consultation: Melena, acute anemia in setting of NSAIDs   HPI: Mia Todd is a 75 y.o. female.  PMH IDDM.  Degenerative spine disease.  Diverticulosis.  GERD. 02/2018 colonoscopy.  Screening study.  5 mm descending polyp removed.  Pandiverticulosis.  Path: Polypoid colonic mucosa with mucosal edema. Remote EGD > 20 years ago.  Several weeks R flank pain evaluated with 12/17/2020 CTAP w/o contrast: Colon diverticulosis.  Redundant sigmoid colon.  Calcified uterine fibroids.  Postsurgical changes in lumbar spine and multilevel spinal DJD. The pain is worse with movement and has been attributed to musculoskeletal source though patient reports no triggering activity, falls prior to onset.  Presented to ED yesterday.  7 day history dark, tarry stools in setting of using 2-3 BC's powders for 3 to 4 weeks switching to ibuprofen 200 mg daily over the last 3 days to address the right flank pain.  Dizziness for several days remittent nausea without emesis.  Though she has diagnosis of GERD, not taking PPI or H2 blocker at home. Baseline GI wise has stable appetite, stable weight.  Infrequent reflux symptoms which she treats with cider vinegar or Tums.  Hgb was 14.1 on 12/03/2020.  Since admission it is gone from 10.1 >> 9.6 >> 10.2.  MCV, platelets, WBCs normal.  C-Met unremarkable other than elevated blood glucose.  INR normal.    At most drinks 1 glass of wine per day but not daily.  Retired customer service supervisor. No family history of colorectal disease, GI bleeding or ulcers.    Past Medical History:  Diagnosis  Date  . Chronic kidney disease   . DDD (degenerative disc disease), lumbar   . Diabetes mellitus without complication (HCC)   . Diverticulitis   . GERD (gastroesophageal reflux disease)   . Hyperlipidemia   . Hypertension   . Recurrent UTI     Past Surgical History:  Procedure Laterality Date  . BACK SURGERY    . BREAST CYST ASPIRATION Left 1974   neg  . COLONOSCOPY WITH PROPOFOL N/A 03/05/2018   Procedure: COLONOSCOPY WITH PROPOFOL;  Surgeon: Skulskie, Martin U, MD;  Location: [facility] ENDOSCOPY;  Service: Endoscopy;  Laterality: N/A;  . DILATION AND CURETTAGE OF UTERUS    . LUMBAR FUSION    . REMOVE OVARIAN CYST    . SPINAL FUSION    . TUBAL LIGATION      Prior to Admission medications   Medication Sig Start Date End Date Taking? Authorizing Provider  amLODipine (NORVASC) 10 MG tablet Take 10 mg by mouth daily.  02/12/14  Yes [provider]  baclofen (LIORESAL) 10 MG tablet Take 10 mg by mouth daily as needed for muscle spasms. 01/04/21  Yes [provider]  gabapentin (NEURONTIN) 300 MG capsule Take 300 mg by mouth daily as needed (pain). 07/18/20  Yes [provider]  glimepiride (AMARYL)   4 MG tablet Take 4 mg by mouth daily. 02/12/14  Yes [provider]  HUMALOG KWIKPEN 100 UNIT/ML KwikPen Inject 5 Units into the skin daily as needed (high blood sugar). Sliding scale 08/04/20  Yes [provider]  insulin detemir (LEVEMIR) 100 UNIT/ML FlexPen Inject 14 Units into the skin daily. 02/19/14  Yes [provider]  metFORMIN (GLUCOPHAGE) 1000 MG tablet Take 1,000 mg by mouth daily.  02/12/14  Yes [provider]  Multiple Vitamin (MULTIVITAMIN) capsule Take 1 capsule by mouth daily.   Yes [provider]  Omega-3 Fatty Acids (SUPER TWIN EPA/DHA) 1250 MG CAPS Take 2 capsules by mouth daily. 05/18/09  Yes [provider]  oxyCODONE-acetaminophen (PERCOCET) 5-325 MG tablet Take 1 tablet by mouth every 8 (eight) hours  as needed for severe pain. 12/03/20  Yes Summers, Rhonda L, PA-C  PREMARIN vaginal cream Place 0.5 g vaginally 2 (two) times a week. 11/05/20  Yes [provider]  Propylene Glycol-Glycerin (SOOTHE) 0.6-0.6 % SOLN Place 1 drop into both eyes daily as needed (dry eyes).   Yes [provider]  benazepril (LOTENSIN) 40 MG tablet Take 40 mg by mouth daily.  12/04/14   [provider]  clindamycin (CLEOCIN) 150 MG capsule Take 2 capsules (300 mg total) by mouth 3 (three) times daily. May dispense as 150mg capsules Patient not taking: No sig reported 07/07/15   Dansie, William, PA-C  Lactobacillus (ACIDOPHILUS PROBIOTIC) 10 MG TABS Take 10 mg by mouth 3 (three) times daily. Patient not taking: No sig reported 07/07/15   Dansie, William, PA-C    Scheduled Meds: . insulin aspart  0-6 Units Subcutaneous Q6H   Infusions: . sodium chloride 50 mL/hr at 01/06/21 2348  . pantoprozole (PROTONIX) infusion 8 mg/hr (01/07/21 0712)   PRN Meds: fentaNYL (SUBLIMAZE) injection, ondansetron (ZOFRAN) IV   Allergies as of 01/06/2021 - Review Complete 01/06/2021  Allergen Reaction Noted  . Doxycycline Shortness Of Breath 07/07/2015  . Levaquin [levofloxacin] Shortness Of Breath 07/07/2015  . Bactrim [sulfamethoxazole-trimethoprim]  07/07/2015  . Ciprofloxacin Other (See Comments) 03/04/2018  . Penicillins Swelling 07/07/2015  . Prednisone  08/10/2020  . Statins Other (See Comments) 03/04/2018  . Sulfa antibiotics Other (See Comments) 03/04/2018    Family History  Problem Relation Age of Onset  . Breast cancer Neg Hx     Social History   Socioeconomic History  . Marital status: Single    Spouse name: Not on file  . Number of children: Not on file  . Years of education: Not on file  . Highest education level: Not on file  Occupational History  . Not on file  Tobacco Use  . Smoking status: Former Smoker    Packs/day: 1.00    Years: 15.00    Pack years: 15.00    Quit  date: 08/15/1991    Years since quitting: 29.4  . Smokeless tobacco: Never Used  Vaping Use  . Vaping Use: Never used  Substance and Sexual Activity  . Alcohol use: Yes    Comment: 5-7 GLASSES WINE/WEEK  . Drug use: Never  . Sexual activity: Not on file  Other Topics Concern  . Not on file  Social History Narrative  . Not on file   Social Determinants of Health   Financial Resource Strain: Not on file  Food Insecurity: Not on file  Transportation Needs: Not on file  Physical Activity: Not on file  Stress: Not on file  Social Connections: Not on file  Intimate   Partner Violence: Not on file    REVIEW OF SYSTEMS: Constitutional: Recently feeling weak but generally has good energy and is active. ENT:  No nose bleeds Pulm: Shortness of breath.  No cough. CV:  No palpitations, no LE edema.  No angina GU:  No hematuria, no frequency GI: See HPI Heme: [place] unusual or excessive bleeding or bruising Transfusions: None ever. Neuro:  No headaches, no peripheral tingling or numbness.  Mild dizziness but no syncope.  No seizures. Derm:  No itching, no rash or sores.  Endocrine:  No sweats or chills.  No polyuria or dysuria Immunization: Immunized against COVID-19.    PHYSICAL EXAM: Vital signs in last 24 hours: Vitals:   01/07/21 0508 01/07/21 0740  BP: (!) 146/69 131/65  Pulse: 77 65  Resp: 15 13  Temp: 98.1 F (36.7 C) 97.7 F (36.5 C)  SpO2: 100% 100%   Wt Readings from Last 3 Encounters:  01/07/21 57.8 kg  12/03/20 59.9 kg  09/30/20 61.2 kg    General: Pleasant, well-appearing, comfortable, hungry Head: No facial asymmetry or swelling Eyes: No scleral icterus.  No conjunctival pallor.  EOMI Ears: No hearing loss Nose: No congestion or discharge Mouth: Good dentition.  Tongue midline.  Oral mucosa pink, moist, clear. Neck: No JVD, no masses, no thyromegaly Lungs: No labored breathing or cough.  Lungs clear with good breath sounds bilaterally Heart: RRR.  Rate in  the 60s.  No MRG.  S1, S2 present. Abdomen: Soft.  Not tender, not distended.  Active bowel sounds.  No HSM, masses, bruits, hernias.  Old, well-healed lower midline scar from remote tubal ligation.   Rectal: Deferred Musc/Skeltl: No joint redness, swelling or gross deformity.  Right lateral rib/flank not tenderness Extremities: No CCE. Neurologic: Alert.  Appropriate.  Oriented x3.  Moves all 4 limbs without gross weakness.  No tremors Skin: No rash, no sores, no suspicious lesions. Nodes: No cervical adenopathy Psych: Calm, pleasant, cooperative  Intake/Output from previous day: 05/26 0701 - 05/27 0700 In: 209 [I.V.:209] Out: -  Intake/Output this shift: No intake/output data recorded.  LAB RESULTS: Recent Labs    01/06/21 1758 01/06/21 2123 01/07/21 0014 01/07/21 0758  WBC 5.9  --  5.4  --   HGB 10.1* 9.6* 9.8* 10.2*  HCT 30.8* 29.5* 29.2* 31.2*  PLT 359  --  359  --    BMET Lab Results  Component Value Date   NA 138 01/07/2021   NA 133 (L) 01/06/2021   NA 139 12/03/2020   K 3.7 01/07/2021   K 4.3 01/06/2021   K 3.9 12/03/2020   CL 106 01/07/2021   CL 102 01/06/2021   CL 106 12/03/2020   CO2 22 01/07/2021   CO2 22 01/06/2021   CO2 22 12/03/2020   GLUCOSE 133 (H) 01/07/2021   GLUCOSE 254 (H) 01/06/2021   GLUCOSE 90 12/03/2020   BUN 8 01/07/2021   BUN 11 01/06/2021   BUN 11 12/03/2020   CREATININE 0.77 01/07/2021   CREATININE 0.85 01/06/2021   CREATININE 0.79 12/03/2020   CALCIUM 9.9 01/07/2021   CALCIUM 9.8 01/06/2021   CALCIUM 9.9 12/03/2020   LFT Recent Labs    01/06/21 1758 01/07/21 0014  PROT 7.0 6.8  ALBUMIN 4.0 3.7  AST 17 15  ALT 15 15  ALKPHOS 50 46  BILITOT 0.7 0.6   PT/INR Lab Results  Component Value Date   INR 0.9 01/07/2021   Hepatitis Panel No results for input(s): HEPBSAG, HCVAB, HEPAIGM, HEPBIGM   in the last 72 hours. C-Diff No components found for: CDIFF Lipase     Component Value Date/Time   LIPASE 27 01/06/2021 1758    LIPASE 111 10/19/2013 1721    Drugs of Abuse  No results found for: LABOPIA, COCAINSCRNUR, LABBENZ, AMPHETMU, THCU, LABBARB   RADIOLOGY STUDIES: No results found.   IMPRESSION:   *    GI bleed.  Setting of aspirin powders and ibuprofen.  *     blood loss anemia.  Has not required PRBCs. Hgb down about 4 g from 3 weeks ago but stable since admission.  *    RLQ pain.  *    benign polypoid mucosa and polyp resected during routine screening colonoscopy 02/2018.  *    Degenerative spine disease.    PLAN:     *  EGD this afternoon.  Repeat CBC in the morning For now continue the Protonix drip PO   Sarah Gribbin  01/07/2021, 9:50 AM Phone [phone]  I have reviewed the entire case in detail with the above APP and discussed the plan in detail.  Therefore, I agree with the diagnoses recorded above. In addition,  I have personally interviewed and examined the patient and have personally reviewed any abdominal/pelvic CT scan images.  My additional thoughts are as follows:  Epigastric palpation caused her pain on the right flank/chest wall.  She also has tenderness along the right lateral and posterior chest wall.  Recent CT scan without apparent GI cause of this pain, was reportedly felt by prehospital provider to be arthritic/musculoskeletal.  About a week of subacute GI bleeding with melena and a hemoglobin drop.  She is hemodynamically stable and looks well.  Denies chest pain or abdominal pain.  Yesterday she felt somewhat short of breath. Her hemoglobin is down about 4 g from last check a few weeks prior.  Chronic NSAID/aspirin use, suspected GU/DU.  She is on a Protonix drip.  EGD planned for later today.  She was agreeable after discussion of procedure and risks.  The benefits and risks of the planned procedure were described in detail with the patient or (when appropriate) their health care proxy.  Risks were outlined as including, but not limited to, bleeding,  infection, perforation, adverse medication reaction leading to cardiac or pulmonary decompensation, pancreatitis (if ERCP).  The limitation of incomplete mucosal visualization was also discussed.  No guarantees or warranties were given.  She is anxious to eat and go home, but further recommendations must await endoscopy findings.   Lytle Malburg L Danis III Office:[phone]     

## 2021-01-07 NOTE — Progress Notes (Signed)
CBG 190 self checked by pt on her home machine confirmed by this nurse.

## 2021-01-07 NOTE — H&P (View-Only) (Signed)
                                                                           [facility] Consult: 9:50 AM 01/07/2021  LOS: 0 days    Referring Provider: Dr Grunz Primary Care Physician:  [place] Primary Gastroenterologist:  Dr. Martin Skulskie.    Reason for Consultation: Melena, acute anemia in setting of NSAIDs   HPI: Mia Todd is a 75 y.o. female.  PMH IDDM.  Degenerative spine disease.  Diverticulosis.  GERD. 02/2018 colonoscopy.  Screening study.  5 mm descending polyp removed.  Pandiverticulosis.  Path: Polypoid colonic mucosa with mucosal edema. Remote EGD > 20 years ago.  Several weeks R flank pain evaluated with 12/17/2020 CTAP w/o contrast: Colon diverticulosis.  Redundant sigmoid colon.  Calcified uterine fibroids.  Postsurgical changes in lumbar spine and multilevel spinal DJD. The pain is worse with movement and has been attributed to musculoskeletal source though patient reports no triggering activity, falls prior to onset.  Presented to ED yesterday.  7 day history dark, tarry stools in setting of using 2-3 BC's powders for 3 to 4 weeks switching to ibuprofen 200 mg daily over the last 3 days to address the right flank pain.  Dizziness for several days remittent nausea without emesis.  Though she has diagnosis of GERD, not taking PPI or H2 blocker at home. Baseline GI wise has stable appetite, stable weight.  Infrequent reflux symptoms which she treats with cider vinegar or Tums.  Hgb was 14.1 on 12/03/2020.  Since admission it is gone from 10.1 >> 9.6 >> 10.2.  MCV, platelets, WBCs normal.  C-Met unremarkable other than elevated blood glucose.  INR normal.    At most drinks 1 glass of wine per day but not daily.  Retired customer service supervisor. No family history of colorectal disease, GI bleeding or ulcers.    Past Medical History:  Diagnosis  Date  . Chronic kidney disease   . DDD (degenerative disc disease), lumbar   . Diabetes mellitus without complication (HCC)   . Diverticulitis   . GERD (gastroesophageal reflux disease)   . Hyperlipidemia   . Hypertension   . Recurrent UTI     Past Surgical History:  Procedure Laterality Date  . BACK SURGERY    . BREAST CYST ASPIRATION Left 1974   neg  . COLONOSCOPY WITH PROPOFOL N/A 03/05/2018   Procedure: COLONOSCOPY WITH PROPOFOL;  Surgeon: Skulskie, Martin U, MD;  Location: [facility] ENDOSCOPY;  Service: Endoscopy;  Laterality: N/A;  . DILATION AND CURETTAGE OF UTERUS    . LUMBAR FUSION    . REMOVE OVARIAN CYST    . SPINAL FUSION    . TUBAL LIGATION      Prior to Admission medications   Medication Sig Start Date End Date Taking? Authorizing Provider  amLODipine (NORVASC) 10 MG tablet Take 10 mg by mouth daily.  02/12/14  Yes [provider]  baclofen (LIORESAL) 10 MG tablet Take 10 mg by mouth daily as needed for muscle spasms. 01/04/21  Yes [provider]  gabapentin (NEURONTIN) 300 MG capsule Take 300 mg by mouth daily as needed (pain). 07/18/20  Yes [provider]  glimepiride (AMARYL)   4 MG tablet Take 4 mg by mouth daily. 02/12/14  Yes [provider]  HUMALOG KWIKPEN 100 UNIT/ML KwikPen Inject 5 Units into the skin daily as needed (high blood sugar). Sliding scale 08/04/20  Yes [provider]  insulin detemir (LEVEMIR) 100 UNIT/ML FlexPen Inject 14 Units into the skin daily. 02/19/14  Yes [provider]  metFORMIN (GLUCOPHAGE) 1000 MG tablet Take 1,000 mg by mouth daily.  02/12/14  Yes [provider]  Multiple Vitamin (MULTIVITAMIN) capsule Take 1 capsule by mouth daily.   Yes [provider]  Omega-3 Fatty Acids (SUPER TWIN EPA/DHA) 1250 MG CAPS Take 2 capsules by mouth daily. 05/18/09  Yes [provider]  oxyCODONE-acetaminophen (PERCOCET) 5-325 MG tablet Take 1 tablet by mouth every 8 (eight) hours  as needed for severe pain. 12/03/20  Yes Summers, Rhonda L, PA-C  PREMARIN vaginal cream Place 0.5 g vaginally 2 (two) times a week. 11/05/20  Yes [provider]  Propylene Glycol-Glycerin (SOOTHE) 0.6-0.6 % SOLN Place 1 drop into both eyes daily as needed (dry eyes).   Yes [provider]  benazepril (LOTENSIN) 40 MG tablet Take 40 mg by mouth daily.  12/04/14   [provider]  clindamycin (CLEOCIN) 150 MG capsule Take 2 capsules (300 mg total) by mouth 3 (three) times daily. May dispense as 150mg capsules Patient not taking: No sig reported 07/07/15   Dansie, William, PA-C  Lactobacillus (ACIDOPHILUS PROBIOTIC) 10 MG TABS Take 10 mg by mouth 3 (three) times daily. Patient not taking: No sig reported 07/07/15   Dansie, William, PA-C    Scheduled Meds: . insulin aspart  0-6 Units Subcutaneous Q6H   Infusions: . sodium chloride 50 mL/hr at 01/06/21 2348  . pantoprozole (PROTONIX) infusion 8 mg/hr (01/07/21 0712)   PRN Meds: fentaNYL (SUBLIMAZE) injection, ondansetron (ZOFRAN) IV   Allergies as of 01/06/2021 - Review Complete 01/06/2021  Allergen Reaction Noted  . Doxycycline Shortness Of Breath 07/07/2015  . Levaquin [levofloxacin] Shortness Of Breath 07/07/2015  . Bactrim [sulfamethoxazole-trimethoprim]  07/07/2015  . Ciprofloxacin Other (See Comments) 03/04/2018  . Penicillins Swelling 07/07/2015  . Prednisone  08/10/2020  . Statins Other (See Comments) 03/04/2018  . Sulfa antibiotics Other (See Comments) 03/04/2018    Family History  Problem Relation Age of Onset  . Breast cancer Neg Hx     Social History   Socioeconomic History  . Marital status: Single    Spouse name: Not on file  . Number of children: Not on file  . Years of education: Not on file  . Highest education level: Not on file  Occupational History  . Not on file  Tobacco Use  . Smoking status: Former Smoker    Packs/day: 1.00    Years: 15.00    Pack years: 15.00    Quit  date: 08/15/1991    Years since quitting: 29.4  . Smokeless tobacco: Never Used  Vaping Use  . Vaping Use: Never used  Substance and Sexual Activity  . Alcohol use: Yes    Comment: 5-7 GLASSES WINE/WEEK  . Drug use: Never  . Sexual activity: Not on file  Other Topics Concern  . Not on file  Social History Narrative  . Not on file   Social Determinants of Health   Financial Resource Strain: Not on file  Food Insecurity: Not on file  Transportation Needs: Not on file  Physical Activity: Not on file  Stress: Not on file  Social Connections: Not on file  Intimate   Partner Violence: Not on file    REVIEW OF SYSTEMS: Constitutional: Recently feeling weak but generally has good energy and is active. ENT:  No nose bleeds Pulm: Shortness of breath.  No cough. CV:  No palpitations, no LE edema.  No angina GU:  No hematuria, no frequency GI: See HPI Heme: [place] unusual or excessive bleeding or bruising Transfusions: None ever. Neuro:  No headaches, no peripheral tingling or numbness.  Mild dizziness but no syncope.  No seizures. Derm:  No itching, no rash or sores.  Endocrine:  No sweats or chills.  No polyuria or dysuria Immunization: Immunized against COVID-19.    PHYSICAL EXAM: Vital signs in last 24 hours: Vitals:   01/07/21 0508 01/07/21 0740  BP: (!) 146/69 131/65  Pulse: 77 65  Resp: 15 13  Temp: 98.1 F (36.7 C) 97.7 F (36.5 C)  SpO2: 100% 100%   Wt Readings from Last 3 Encounters:  01/07/21 57.8 kg  12/03/20 59.9 kg  09/30/20 61.2 kg    General: Pleasant, well-appearing, comfortable, hungry Head: No facial asymmetry or swelling Eyes: No scleral icterus.  No conjunctival pallor.  EOMI Ears: No hearing loss Nose: No congestion or discharge Mouth: Good dentition.  Tongue midline.  Oral mucosa pink, moist, clear. Neck: No JVD, no masses, no thyromegaly Lungs: No labored breathing or cough.  Lungs clear with good breath sounds bilaterally Heart: RRR.  Rate in  the 60s.  No MRG.  S1, S2 present. Abdomen: Soft.  Not tender, not distended.  Active bowel sounds.  No HSM, masses, bruits, hernias.  Old, well-healed lower midline scar from remote tubal ligation.   Rectal: Deferred Musc/Skeltl: No joint redness, swelling or gross deformity.  Right lateral rib/flank not tenderness Extremities: No CCE. Neurologic: Alert.  Appropriate.  Oriented x3.  Moves all 4 limbs without gross weakness.  No tremors Skin: No rash, no sores, no suspicious lesions. Nodes: No cervical adenopathy Psych: Calm, pleasant, cooperative  Intake/Output from previous day: 05/26 0701 - 05/27 0700 In: 209 [I.V.:209] Out: -  Intake/Output this shift: No intake/output data recorded.  LAB RESULTS: Recent Labs    01/06/21 1758 01/06/21 2123 01/07/21 0014 01/07/21 0758  WBC 5.9  --  5.4  --   HGB 10.1* 9.6* 9.8* 10.2*  HCT 30.8* 29.5* 29.2* 31.2*  PLT 359  --  359  --    BMET Lab Results  Component Value Date   NA 138 01/07/2021   NA 133 (L) 01/06/2021   NA 139 12/03/2020   K 3.7 01/07/2021   K 4.3 01/06/2021   K 3.9 12/03/2020   CL 106 01/07/2021   CL 102 01/06/2021   CL 106 12/03/2020   CO2 22 01/07/2021   CO2 22 01/06/2021   CO2 22 12/03/2020   GLUCOSE 133 (H) 01/07/2021   GLUCOSE 254 (H) 01/06/2021   GLUCOSE 90 12/03/2020   BUN 8 01/07/2021   BUN 11 01/06/2021   BUN 11 12/03/2020   CREATININE 0.77 01/07/2021   CREATININE 0.85 01/06/2021   CREATININE 0.79 12/03/2020   CALCIUM 9.9 01/07/2021   CALCIUM 9.8 01/06/2021   CALCIUM 9.9 12/03/2020   LFT Recent Labs    01/06/21 1758 01/07/21 0014  PROT 7.0 6.8  ALBUMIN 4.0 3.7  AST 17 15  ALT 15 15  ALKPHOS 50 46  BILITOT 0.7 0.6   PT/INR Lab Results  Component Value Date   INR 0.9 01/07/2021   Hepatitis Panel No results for input(s): HEPBSAG, HCVAB, HEPAIGM, HEPBIGM  in the last 72 hours. C-Diff No components found for: CDIFF Lipase     Component Value Date/Time   LIPASE 27 01/06/2021 1758    LIPASE 111 10/19/2013 1721    Drugs of Abuse  No results found for: LABOPIA, COCAINSCRNUR, LABBENZ, AMPHETMU, THCU, LABBARB   RADIOLOGY STUDIES: No results found.   IMPRESSION:   *    GI bleed.  Setting of aspirin powders and ibuprofen.  *     blood loss anemia.  Has not required PRBCs. Hgb down about 4 g from 3 weeks ago but stable since admission.  *    RLQ pain.  *    benign polypoid mucosa and polyp resected during routine screening colonoscopy 02/2018.  *    Degenerative spine disease.    PLAN:     *  EGD this afternoon.  Repeat CBC in the morning For now continue the Protonix drip PO   Azucena Freed  01/07/2021, 9:50 AM Phone [phone]  I have reviewed the entire case in detail with the above APP and discussed the plan in detail.  Therefore, I agree with the diagnoses recorded above. In addition,  I have personally interviewed and examined the patient and have personally reviewed any abdominal/pelvic CT scan images.  My additional thoughts are as follows:  Epigastric palpation caused her pain on the right flank/chest wall.  She also has tenderness along the right lateral and posterior chest wall.  Recent CT scan without apparent GI cause of this pain, was reportedly felt by prehospital provider to be arthritic/musculoskeletal.  About a week of subacute GI bleeding with melena and a hemoglobin drop.  She is hemodynamically stable and looks well.  Denies chest pain or abdominal pain.  Yesterday she felt somewhat short of breath. Her hemoglobin is down about 4 g from last check a few weeks prior.  Chronic NSAID/aspirin use, suspected GU/DU.  She is on a Protonix drip.  EGD planned for later today.  She was agreeable after discussion of procedure and risks.  The benefits and risks of the planned procedure were described in detail with the patient or (when appropriate) their health care proxy.  Risks were outlined as including, but not limited to, bleeding,  infection, perforation, adverse medication reaction leading to cardiac or pulmonary decompensation, pancreatitis (if ERCP).  The limitation of incomplete mucosal visualization was also discussed.  No guarantees or warranties were given.  She is anxious to eat and go home, but further recommendations must await endoscopy findings.   Nelida Meuse III [phone]

## 2021-01-07 NOTE — Progress Notes (Signed)
CBG 172

## 2021-01-07 NOTE — Progress Notes (Signed)
CBG: 150 

## 2021-01-07 NOTE — Progress Notes (Signed)
Pt has arrived to 2w05. Alert and oriented x 4, pt identified appropriately. VS stable, denied chest pain, SOB and no acute distress noted. Cardiac monitor and continuous pulse ox in place and CCMD notified. Pt advised about belongings policy and pt will keep belongings at the bedside.  Pt ambulatory with steady gate, mild SOB with excretion noted. Instructed to use call bell for assistance, bed alarm in place and call bell left within pt reach. Will continue to monitor pt and treat per MD orders.

## 2021-01-07 NOTE — Interval H&P Note (Signed)
History and Physical Interval Note:  01/07/2021 2:07 PM  Mia Todd  has presented today for surgery, with the diagnosis of Melenic stool.  Blood loss anemia.  Use of BC powders and ibuprofen.  Right flank pain.  The various methods of treatment have been discussed with the patient and family. After consideration of risks, benefits and other options for treatment, the patient has consented to  Procedure(s): ESOPHAGOGASTRODUODENOSCOPY (EGD) WITH PROPOFOL (N/A) as a surgical intervention.  The patient's history has been reviewed, patient examined, no change in status, stable for surgery.  I have reviewed the patient's chart and labs.  Questions were answered to the patient's satisfaction.    No clinical change since I last saw patient this morning.  Nelida Meuse III

## 2021-01-07 NOTE — Anesthesia Preprocedure Evaluation (Addendum)
Anesthesia Evaluation  Patient identified by MRN, date of birth, ID band Patient awake    Reviewed: Allergy & Precautions, NPO status , Patient's Chart, lab work & pertinent test results  Airway Mallampati: II  TM Distance: >3 FB Neck ROM: Full    Dental  (+) Dental Advisory Given, Missing,    Pulmonary former smoker,    breath sounds clear to auscultation       Cardiovascular hypertension,  Rhythm:Regular Rate:Normal     Neuro/Psych negative neurological ROS  negative psych ROS   GI/Hepatic Neg liver ROS, GERD  ,  Endo/Other  diabetes  Renal/GU CRFRenal disease     Musculoskeletal  (+) Arthritis ,   Abdominal Normal abdominal exam  (+)   Peds  Hematology negative hematology ROS (+)   Anesthesia Other Findings   Reproductive/Obstetrics                            Anesthesia Physical Anesthesia Plan  ASA: III  Anesthesia Plan: MAC   Post-op Pain Management:    Induction: Intravenous  PONV Risk Score and Plan: 2 and Propofol infusion and Ondansetron  Airway Management Planned: Natural Airway and Nasal Cannula  Additional Equipment: None  Intra-op Plan:   Post-operative Plan:   Informed Consent: I have reviewed the patients History and Physical, chart, labs and discussed the procedure including the risks, benefits and alternatives for the proposed anesthesia with the patient or authorized representative who has indicated his/her understanding and acceptance.       Plan Discussed with: CRNA  Anesthesia Plan Comments:        Anesthesia Quick Evaluation

## 2021-01-07 NOTE — Op Note (Signed)
[facility] Patient Name: Mia Todd Procedure Date : 01/07/2021 MRN: [id number] Attending MD: Estill Cotta. Loletha Carrow , MD Date of Birth: August 24, 1945 CSN: [id number] Age: 75 Admit Type: Inpatient Procedure:                Upper GI endoscopy Indications:              Acute post hemorrhagic anemia, Melena Providers:                Mallie Mussel L. Loletha Carrow, MD, Grace Isaac, RN, Tyna Jaksch                            Technician Referring MD:             [facility] Medicines:                Monitored Anesthesia Care Complications:            No immediate complications. Estimated Blood Loss:     Estimated blood loss was minimal. Procedure:                Pre-Anesthesia Assessment:                           - Prior to the procedure, a History and Physical                            was performed, and patient medications and                            allergies were reviewed. The patient's tolerance of                            previous anesthesia was also reviewed. The risks                            and benefits of the procedure and the sedation                            options and risks were discussed with the patient.                            All questions were answered, and informed consent                            was obtained. Prior Anticoagulants: The patient has                            taken no previous anticoagulant or antiplatelet                            agents. ASA Grade Assessment: II - A patient with                            mild systemic disease. After reviewing the risks  and benefits, the patient was deemed in                            satisfactory condition to undergo the procedure.                           After obtaining informed consent, the endoscope was                            passed under direct vision. Throughout the                            procedure, the patient's blood pressure, pulse, and                             oxygen saturations were monitored continuously. The                            GIF-H190 ([id number] Olympus gastroscope was                            introduced through the mouth, and advanced to the                            second part of duodenum. The upper GI endoscopy was                            accomplished without difficulty. The patient                            tolerated the procedure well. Scope In: Scope Out: Findings:      Two small islands of salmon-colored mucosa were present at 38 cm. No       other visible abnormalities were present. The maximum longitudinal       extent of these esophageal mucosal changes was 1 cm in length. Biopsies       were taken with a cold forceps for histology.      The exam of the esophagus was otherwise normal.      A few erosions with no bleeding and no stigmata of recent bleeding were       found in the gastric antrum. Biopsies were taken with a cold forceps for       histology.      The exam of the stomach was otherwise normal.      The examined duodenum was normal. Impression:               - Salmon-colored mucosa suspicious for                            short-segment Barrett's esophagus. Biopsied.                           - Gastric erosions with no bleeding and no stigmata  of recent bleeding. Biopsied.                           - Normal examined duodenum.                           Endoscopic findings do not explain a week of melena                            and anemia. Recommendation:           - Clear liquid diet.                           - Return patient to hospital ward for ongoing care.                           - Perform a colonoscopy tomorrow. Procedure Code(s):        --- Professional ---                           [phone], Esophagogastroduodenoscopy, flexible,                            transoral; with biopsy, single or multiple Diagnosis Code(s):        --- Professional ---                            K22.8, Other specified diseases of esophagus                           K25.9, Gastric ulcer, unspecified as acute or                            chronic, without hemorrhage or perforation                           D62, Acute posthemorrhagic anemia                           K92.1, Melena (includes Hematochezia) CPT copyright 2019 American Medical Association. All rights reserved. The codes documented in this report are preliminary and upon coder review may  be revised to meet current compliance requirements. Nylani Michetti L. Loletha Carrow, MD 01/07/2021 2:38:31 PM This report has been signed electronically. Number of Addenda: 0

## 2021-01-07 NOTE — Progress Notes (Signed)
   01/07/21 1100  Clinical Encounter Type  Visited With Patient  Visit Type Initial  Referral From Patient  Consult/Referral To Chaplain  Spiritual Encounters  Spiritual Needs Literature  The chaplain responded to spiritual consult for an advanced directive. The chaplain provided education and the patient is interested in completing an "AD" however, there is no notary present in the building. The patient would like her son who is the oldest to be contacted for emergencies. His name is Ledell Noss. He lives in [place]. His cell phone number is [phone]. The chaplain will follow up as needed.

## 2021-01-07 NOTE — Plan of Care (Signed)

## 2021-01-07 NOTE — Progress Notes (Signed)
Pt has freestyle libre, CBG 122.

## 2021-01-07 NOTE — Progress Notes (Signed)
Patient back to room. NAD noted. VS checked and cardiac monitor applied.

## 2021-01-07 NOTE — Progress Notes (Signed)
CBG 101 per pt self check. Verified by this nurse.

## 2021-01-08 ENCOUNTER — Encounter (HOSPITAL_COMMUNITY)
Admission: EM | Disposition: A | Discharge status: Home / Self Care | Payer: Self-pay | Source: Home / Self Care | Attending: Family Medicine

## 2021-01-08 ENCOUNTER — Inpatient Hospital Stay (HOSPITAL_COMMUNITY): Payer: Medicare Other | Admitting: Anesthesiology

## 2021-01-08 ENCOUNTER — Encounter (HOSPITAL_COMMUNITY): Payer: Self-pay | Admitting: Internal Medicine

## 2021-01-08 DIAGNOSIS — K922 Gastrointestinal hemorrhage, unspecified: Secondary | ICD-10-CM | POA: Diagnosis not present

## 2021-01-08 DIAGNOSIS — E119 Type 2 diabetes mellitus without complications: Secondary | ICD-10-CM | POA: Diagnosis not present

## 2021-01-08 DIAGNOSIS — D62 Acute posthemorrhagic anemia: Secondary | ICD-10-CM | POA: Diagnosis not present

## 2021-01-08 DIAGNOSIS — K219 Gastro-esophageal reflux disease without esophagitis: Secondary | ICD-10-CM | POA: Diagnosis not present

## 2021-01-08 HISTORY — PX: COLONOSCOPY WITH PROPOFOL: SHX5780

## 2021-01-08 LAB — CBC
HCT: 32.9 % — ABNORMAL LOW (ref 36.0–46.0)
Hemoglobin: 10.6 g/dL — ABNORMAL LOW (ref 12.0–15.0)
MCH: 29 pg (ref 26.0–34.0)
MCHC: 32.2 g/dL (ref 30.0–36.0)
MCV: 89.9 fL (ref 80.0–100.0)
Platelets: 387 10*3/uL (ref 150–400)
RBC: 3.66 MIL/uL — ABNORMAL LOW (ref 3.87–5.11)
RDW: 14.5 % (ref 11.5–15.5)
WBC: 7.6 10*3/uL (ref 4.0–10.5)
nRBC: 0 % (ref 0.0–0.2)

## 2021-01-08 LAB — GLUCOSE, CAPILLARY: Glucose-Capillary: 77 mg/dL (ref 70–99)

## 2021-01-08 SURGERY — COLONOSCOPY WITH PROPOFOL
Anesthesia: Monitor Anesthesia Care

## 2021-01-08 MED ORDER — SODIUM CHLORIDE 0.9 % IV SOLN: INTRAVENOUS | Status: DC | PRN

## 2021-01-08 MED ORDER — PANTOPRAZOLE SODIUM 40 MG PO TBEC: 40.0000 mg | DELAYED_RELEASE_TABLET | Freq: Every day | ORAL | 0 refills | Status: DC

## 2021-01-08 MED ORDER — PROPOFOL 10 MG/ML IV BOLUS
INTRAVENOUS | Status: DC | PRN
  Administered 2021-01-08: 20 mg via INTRAVENOUS
  Administered 2021-01-08: 30 mg via INTRAVENOUS

## 2021-01-08 MED ORDER — PROPOFOL 500 MG/50ML IV EMUL
INTRAVENOUS | Status: DC | PRN
  Administered 2021-01-08: 100 ug/kg/min via INTRAVENOUS

## 2021-01-08 SURGICAL SUPPLY — 21 items

## 2021-01-08 NOTE — Anesthesia Postprocedure Evaluation (Signed)
Anesthesia Post Note  Patient: Mia Todd  Procedure(s) Performed: COLONOSCOPY WITH PROPOFOL (N/A )     Patient location during evaluation: Endoscopy Anesthesia Type: MAC Level of consciousness: awake and alert Pain management: pain level controlled Vital Signs Assessment: post-procedure vital signs reviewed and stable Respiratory status: spontaneous breathing, nonlabored ventilation, respiratory function stable and patient connected to nasal cannula oxygen Cardiovascular status: stable and blood pressure returned to baseline Postop Assessment: no apparent nausea or vomiting Anesthetic complications: no   No complications documented.  Last Vitals:  Vitals:   01/08/21 1237 01/08/21 1259  BP: (!) 160/73 (!) 180/80  Pulse: 81 80  Resp: 20 18  Temp: 36.9 C 37.1 C  SpO2: 100% 98%    Last Pain:  Vitals:   01/08/21 1259  TempSrc: Oral  PainSc: 0-No pain                 Catalina Gravel

## 2021-01-08 NOTE — Anesthesia Preprocedure Evaluation (Addendum)
Anesthesia Evaluation  Patient identified by MRN, date of birth, ID band Patient awake    Reviewed: Allergy & Precautions, NPO status , Patient's Chart, lab work & pertinent test results  Airway Mallampati: II  TM Distance: >3 FB Neck ROM: Full    Dental  (+) Teeth Intact, Dental Advisory Given   Pulmonary former smoker,    Pulmonary exam normal breath sounds clear to auscultation       Cardiovascular hypertension, Pt. on medications Normal cardiovascular exam Rhythm:Regular Rate:Normal     Neuro/Psych negative neurological ROS     GI/Hepatic Neg liver ROS, GERD  ,GIB   Endo/Other  diabetes, Type 2, Oral Hypoglycemic Agents, Insulin Dependent  Renal/GU Renal InsufficiencyRenal disease     Musculoskeletal  (+) Arthritis ,   Abdominal   Peds  Hematology  (+) Blood dyscrasia, anemia ,   Anesthesia Other Findings   Reproductive/Obstetrics                            Anesthesia Physical Anesthesia Plan  ASA: III  Anesthesia Plan: MAC   Post-op Pain Management:    Induction: Intravenous  PONV Risk Score and Plan: 2 and Propofol infusion and Treatment may vary due to age or medical condition  Airway Management Planned: Natural Airway and Nasal Cannula  Additional Equipment:   Intra-op Plan:   Post-operative Plan:   Informed Consent: I have reviewed the patients History and Physical, chart, labs and discussed the procedure including the risks, benefits and alternatives for the proposed anesthesia with the patient or authorized representative who has indicated his/her understanding and acceptance.     Dental advisory given  Plan Discussed with: CRNA  Anesthesia Plan Comments:        Anesthesia Quick Evaluation

## 2021-01-08 NOTE — Op Note (Signed)
[facility] Patient Name: Mia Todd Procedure Date : 01/08/2021 MRN: [id number] Attending MD: Estill Cotta. Loletha Carrow , MD Date of Birth: 21-Oct-1945 CSN: [id number] Age: 75 Admit Type: Inpatient Procedure:                Colonoscopy Indications:              Melena (for one week PTA), Acute post hemorrhagic                            anemia                           chronic aspirin use. No bleeding source on EGD                            yesterday. Hgb stable Providers:                Mallie Mussel L. Loletha Carrow, MD, Nelia Shi, RN, Tyna Jaksch Technician Referring MD:             [facility] Medicines:                Monitored Anesthesia Care Complications:            No immediate complications. Estimated Blood Loss:     Estimated blood loss: none. Procedure:                Pre-Anesthesia Assessment:                           - Prior to the procedure, a History and Physical                            was performed, and patient medications and                            allergies were reviewed. The patient's tolerance of                            previous anesthesia was also reviewed. The risks                            and benefits of the procedure and the sedation                            options and risks were discussed with the patient.                            All questions were answered, and informed consent                            was obtained. Prior Anticoagulants: The patient has                            taken no previous anticoagulant  or antiplatelet                            agents. ASA Grade Assessment: II - A patient with                            mild systemic disease. After reviewing the risks                            and benefits, the patient was deemed in                            satisfactory condition to undergo the procedure.                           After obtaining informed consent, the colonoscope                             was passed under direct vision. Throughout the                            procedure, the patient's blood pressure, pulse, and                            oxygen saturations were monitored continuously. The                            [id number]-HQ190L ([id number] Olympus colonoscope was                            introduced through the anus and advanced to the the                            cecum, identified by appendiceal orifice and                            ileocecal valve. The colonoscopy was somewhat                            difficult due to multiple diverticula in the colon,                            inadequate bowel prep and a redundant colon. The                            patient tolerated the procedure well. The quality                            of the bowel preparation was inadequate. The                            ileocecal valve, appendiceal orifice, and rectum  were photographed. The bowel preparation used was                            MoviPrep. Scope In: 12:00:38 PM Scope Out: 12:12:18 PM Scope Withdrawal Time: 0 hours 4 minutes 54 seconds  Total Procedure Duration: 0 hours 11 minutes 40 seconds  Findings:      The perianal and digital rectal examinations were normal.      Many large-mouthed diverticula were found in the entire colon.      A large amount of semi-liquid solid stool was found in the entire colon,       precluding visualization.      The exam was otherwise without abnormality on direct and retroflexion       views. Impression:               - Preparation of the colon was inadequate.                           - Diverticulosis in the entire examined colon.                           - Stool in the entire examined colon.                           - The examination was otherwise normal on direct                            and retroflexion views.                           - No specimens collected.                            There is no overt GI bleeding at present. ? slow                            right sided-diverticular bleed, but more likely                            small bowel source i.e. ulcer or AVM Recommendation:           - Return patient to hospital ward for ongoing care.                           - Patient will be offered further inpatient                            evaluation with repeat bowel preparation ,                            colonoscopy tomorrow and subsequent video capsule                            endoscopy if no bleeding source on colonoscopy. Procedure Code(s):        --- Professional ---  45378, Colonoscopy, flexible; diagnostic, including                            collection of specimen(s) by brushing or washing,                            when performed (separate procedure) Diagnosis Code(s):        --- Professional ---                           K92.1, Melena (includes Hematochezia)                           D62, Acute posthemorrhagic anemia                           K57.30, Diverticulosis of large intestine without                            perforation or abscess without bleeding CPT copyright 2019 American Medical Association. All rights reserved. The codes documented in this report are preliminary and upon coder review may  be revised to meet current compliance requirements. Noach Calvillo L. Loletha Carrow, MD 01/08/2021 12:23:11 PM This report has been signed electronically. Number of Addenda: 0

## 2021-01-08 NOTE — Discharge Summary (Signed)
Physician Discharge Summary  Mia Todd YFV:[id number] DOB: [birth year] DOA: 01/06/2021  PCP: [facility]  Admit date: 01/06/2021 Discharge date: 01/08/2021  Admitted From: Home Disposition: Home   Recommendations for Outpatient Follow-up:  1. Follow up with PCP in 1-2 weeks, repeat CBC to monitor Hgb at follow up. 2. Follow up with GI for repeat colonoscopy (poor prep 5/28) and consideration of small bowel/capsule endoscopy if colo unrevealing.  3. Please follow up on the following pending results: Biopsy results to be communicated to patient per [facility].   Home Health: None Equipment/Devices: None Discharge Condition: Stable CODE STATUS: Full Diet recommendation: Heart healthy  Brief/Interim Summary: Mia Todd is a 75 y.o. female with a history of IDT2DM, HTN, GERD, diverticulosis who presented to the ED 5/26 with blood in stool followed by loose dark tarry stools over the past 4-5 days associated with nausea, no vomiting, and dizziness. She was recently evaluated for right abdominal/rib pain felt to be musculoskeletal, treated with BC powder and ibuprofen over the past few weeks. Hgb was found to be 10.1g/dl from prior baseline of 14-15. Hemodynamically stable. PPI gtt started and patient admitted with serial blood counts showing stability, no bleeding since arrival. GI was consulted, found no source of bleeding on EGD. subsequent colonoscopy was severely limited by poor prep. The patient opted to be discharged and repeat colonoscopy as outpatient.   Discharge Diagnoses:  Principal Problem:   Acute upper GI bleed Active Problems:   Acute blood loss anemia   Hypertension   Diabetes mellitus without complication (HCC)   GERD (gastroesophageal reflux disease)  Symptomatic acute blood loss anemia due to GI bleed: No evidence of coagulopathy or alternative sites of bleeding. - Hgb stable, not at transfusion threshold.   GI bleeding, suspected upper GI source,  possible NSAID-induced ulcer disease given recent history, though none seen on EGD. Last EGD (hx GERD, not on medications) in [place] was ~2006. Colonoscopy by Dr. Donnella Sham at [facility] 03/05/2018 with sessile polyp and diverticulosis. - Continue PPI empirically per GI, follow up for colonoscopy.   Right abdominal pain: Largely negative CT abd/pelvis previously though this was without contrast. LFTs all normal. Exam reassuring, if anything most consistent with MSK etiology.  - Tylenol prn  - Will defer further work up to PCP. No red flags on exxam at this time.   IDT2DM: HbA1c 7.4% - Home meds.  HTN:  - BPs starting to rise while holding home medications. Will restart them.  EGD 01/07/2021 Dr. Loletha Carrow:  Impression:       - Salmon-colored mucosa suspicious for                            short-segment Barrett's esophagus. Biopsied.                           - Gastric erosions with no bleeding and no stigmata                            of recent bleeding. Biopsied.                           - Normal examined duodenum.                           Endoscopic findings do not  explain a week of melena                            and anemia. Recommendation: - Clear liquid diet.                           - Return patient to hospital ward for ongoing care.                           - Perform a colonoscopy tomorrow.  Colonoscopy 01/08/2021 Dr. Loletha Carrow:  Impression:        - Preparation of the colon was inadequate.                           - Diverticulosis in the entire examined colon.                           - Stool in the entire examined colon.                           - The examination was otherwise normal on direct                            and retroflexion views.                           - No specimens collected.                           There is no overt GI bleeding at present. ? slow                            right sided-diverticular bleed, but more likely                            small  bowel source i.e. ulcer or AVM Recommendation: - Return patient to hospital ward for ongoing care.                           - Patient will be offered further inpatient                            evaluation with repeat bowel preparation ,                            colonoscopy tomorrow and subsequent video capsule                            endoscopy if no bleeding source on colonoscopy.  Discharge Instructions Discharge Instructions    Diet - low sodium heart healthy   Complete by: As directed    Discharge instructions   Complete by: As directed    You were evaluated for GI bleeding, the source of which was not identified on upper endoscopy and unable to be seen on colonoscopy today. Fortunately you have stopped bleeding and your blood count  has stabilized, so you are stable for discharge. Given the option, GI agrees it is reasonable to repeat colonoscopy as an outpatient. Please follow up to discuss these plans and avoid NSAIDs and aspirin. NSAIDs include ibuprofen, motrin, naproxen, aleve, advil, goody's, etc.   Please also take protonix once daily for 2 weeks to heal an ulcer that may be in your small bowel but wasn't seen on EGD. Dr. Loletha Carrow will contact you with biopsy results.  If you notice any further bleeding, seek medical attention right away.   Increase activity slowly   Complete by: As directed      Allergies as of 01/08/2021      Reactions   Doxycycline Shortness Of Breath   Difficulty taking deep breaths   Levaquin [levofloxacin] Shortness Of Breath   Bactrim [sulfamethoxazole-trimethoprim]    She does not remember what her reaction is but she knows she is allergic to it.   Ciprofloxacin Other (See Comments)   Penicillins Swelling   Joint swelling   Prednisone    Other reaction(s): Headache, Other (See Comments) Uncontrollable blood sugar - states she did not have this reaction from cortisone injections   Statins Other (See Comments)   Sulfa Antibiotics Other (See  Comments)      Medication List    STOP taking these medications   Acidophilus Probiotic 10 MG Tabs   clindamycin 150 MG capsule Commonly known as: CLEOCIN     TAKE these medications   amLODipine 10 MG tablet Commonly known as: NORVASC Take 10 mg by mouth daily.   baclofen 10 MG tablet Commonly known as: LIORESAL Take 10 mg by mouth daily as needed for muscle spasms.   benazepril 40 MG tablet Commonly known as: LOTENSIN Take 40 mg by mouth daily.   gabapentin 300 MG capsule Commonly known as: NEURONTIN Take 300 mg by mouth daily as needed (pain).   glimepiride 4 MG tablet Commonly known as: AMARYL Take 4 mg by mouth daily.   HumaLOG KwikPen 100 UNIT/ML KwikPen Generic drug: insulin lispro Inject 5 Units into the skin daily as needed (high blood sugar). Sliding scale   insulin detemir 100 UNIT/ML FlexPen Commonly known as: LEVEMIR Inject 14 Units into the skin daily.   metFORMIN 1000 MG tablet Commonly known as: GLUCOPHAGE Take 1,000 mg by mouth daily.   multivitamin capsule Take 1 capsule by mouth daily.   oxyCODONE-acetaminophen 5-325 MG tablet Commonly known as: Percocet Take 1 tablet by mouth every 8 (eight) hours as needed for severe pain.   pantoprazole 40 MG tablet Commonly known as: Protonix Take 1 tablet (40 mg total) by mouth daily for 14 days.   Premarin vaginal cream Generic drug: conjugated estrogens Place 0.5 g vaginally 2 (two) times a week.   Soothe 0.6-0.6 % Soln Generic drug: Propylene Glycol-Glycerin Place 1 drop into both eyes daily as needed (dry eyes).   Super Twin EPA/DHA 1250 MG Caps Take 2 capsules by mouth daily.       Follow-up Information    [place] Follow up.   Contact information: [place] [address] [phone]        Doran Stabler, MD. Schedule an appointment as soon as possible for a visit.   Specialty: Gastroenterology Contact information: [address]  [address] [address] [phone]              Allergies  Allergen Reactions  . Doxycycline Shortness Of Breath  Difficulty taking deep breaths  . Levaquin [Levofloxacin] Shortness Of Breath  . Bactrim [Sulfamethoxazole-Trimethoprim]     She does not remember what her reaction is but she knows she is allergic to it.  . Ciprofloxacin Other (See Comments)  . Penicillins Swelling    Joint swelling  . Prednisone     Other reaction(s): Headache, Other (See Comments) Uncontrollable blood sugar - states she did not have this reaction from cortisone injections  . Statins Other (See Comments)  . Sulfa Antibiotics Other (See Comments)    Consultations:  GI  Procedures/Studies: CT ABDOMEN PELVIS WO CONTRAST  Result Date: 12/17/2020 CLINICAL DATA:  Right lower quadrant pain for several weeks EXAM: CT ABDOMEN AND PELVIS WITHOUT CONTRAST TECHNIQUE: Multidetector CT imaging of the abdomen and pelvis was performed following the standard protocol without IV contrast. COMPARISON:  None. FINDINGS: Lower chest: No acute abnormality. Hepatobiliary: No focal liver abnormality is seen. No gallstones, gallbladder wall thickening, or biliary dilatation. Pancreas: Unremarkable. No pancreatic ductal dilatation or surrounding inflammatory changes. Spleen: Normal in size without focal abnormality. Adrenals/Urinary Tract: Adrenal glands are within normal limits. Kidneys are well visualized bilaterally. No renal calculi obstructive changes are seen. Ureters are within normal limits. The bladder is well distended. Stomach/Bowel: Redundant sigmoid colon is noted with evidence of diverticulosis. No diverticulitis is seen. No obstructive or inflammatory changes are noted. The appendix is contrast filled and within normal limits. Small bowel and stomach are unremarkable. Vascular/Lymphatic: Aortic atherosclerosis. No enlarged abdominal or pelvic lymph nodes. Reproductive: Multiple calcified uterine fibroids are  noted. No other focal abnormality is seen. Other: No abdominal wall hernia or abnormality. No abdominopelvic ascites. Musculoskeletal: Postsurgical changes in the lower lumbar spine. No acute bony abnormality is seen. Multilevel degenerative changes are noted. IMPRESSION: Diverticulosis without diverticulitis. Chronic changes as described above without acute abnormality to correspond with the given clinical history. Electronically Signed   By: Inez Catalina M.D.   On: 12/17/2020 19:12     Subjective: Feels well, wants to go home. No further bleeding at all. Ambulating and eating at baseline.   Discharge Exam: Vitals:   01/08/21 1237 01/08/21 1259  BP: (!) 160/73 (!) 180/80  Pulse: 81 80  Resp: 20 18  Temp: 98.5 F (36.9 C) 98.7 F (37.1 C)  SpO2: 100% 98%   General: Pt is alert, awake, not in acute distress Cardiovascular: RRR, S1/S2 +, no rubs, no gallops Respiratory: CTA bilaterally, no wheezing, no rhonchi Abdominal: Soft, NT, ND, bowel sounds + Extremities: No edema, no cyanosis  Labs: BNP (last 3 results) No results for input(s): BNP in the last 8760 hours. Basic Metabolic Panel: Recent Labs  Lab 01/06/21 1758 01/07/21 0014  NA 133* 138  K 4.3 3.7  CL 102 106  CO2 22 22  GLUCOSE 254* 133*  BUN 11 8  CREATININE 0.85 0.77  CALCIUM 9.8 9.9  MG  --  2.2  2.2   Liver Function Tests: Recent Labs  Lab 01/06/21 1758 01/07/21 0014  AST 17 15  ALT 15 15  ALKPHOS 50 46  BILITOT 0.7 0.6  PROT 7.0 6.8  ALBUMIN 4.0 3.7   Recent Labs  Lab 01/06/21 1758  LIPASE 27   No results for input(s): AMMONIA in the last 168 hours. CBC: Recent Labs  Lab 01/06/21 1758 01/06/21 2123 01/07/21 0014 01/07/21 0758 01/08/21 0100  WBC 5.9  --  5.4  --  7.6  NEUTROABS 3.9  --   --   --   --  HGB 10.1* 9.6* 9.8* 10.2* 10.6*  HCT 30.8* 29.5* 29.2* 31.2* 32.9*  MCV 89.8  --  86.6  --  89.9  PLT 359  --  359  --  387   Cardiac Enzymes: No results for input(s): CKTOTAL, CKMB,  CKMBINDEX, TROPONINI in the last 168 hours. BNP: Invalid input(s): POCBNP CBG: Recent Labs  Lab 01/07/21 1354 01/08/21 1221  GLUCAP 133* 77   D-Dimer No results for input(s): DDIMER in the last 72 hours. Hgb A1c Recent Labs    01/06/21 2125  HGBA1C 7.4*   Lipid Profile No results for input(s): CHOL, HDL, LDLCALC, TRIG, CHOLHDL, LDLDIRECT in the last 72 hours. Thyroid function studies No results for input(s): TSH, T4TOTAL, T3FREE, THYROIDAB in the last 72 hours.  Invalid input(s): FREET3 Anemia work up Recent Labs    01/06/21 2123 01/07/21 0014  FOLATE  --  14.5  FERRITIN  --  22  TIBC  --  357  IRON  --  31  RETICCTPCT 3.7*  --    Urinalysis    Component Value Date/Time   COLORURINE STRAW (A) 01/06/2021 1758   APPEARANCEUR CLEAR 01/06/2021 1758   APPEARANCEUR Hazy 10/19/2013 1721   LABSPEC 1.003 (L) 01/06/2021 1758   LABSPEC 1.014 10/19/2013 1721   PHURINE 6.0 01/06/2021 1758   GLUCOSEU 50 (A) 01/06/2021 1758   GLUCOSEU 50 mg/dL 10/19/2013 1721   HGBUR NEGATIVE 01/06/2021 1758   BILIRUBINUR NEGATIVE 01/06/2021 1758   BILIRUBINUR Negative 10/19/2013 1721   KETONESUR NEGATIVE 01/06/2021 1758   PROTEINUR NEGATIVE 01/06/2021 1758   NITRITE NEGATIVE 01/06/2021 1758   LEUKOCYTESUR NEGATIVE 01/06/2021 1758   LEUKOCYTESUR 1+ 10/19/2013 1721    Microbiology Recent Results (from the past 240 hour(s))  Resp Panel by RT-PCR (Flu A&B, Covid) Nasopharyngeal Swab     Status: None   Collection Time: 01/06/21  7:50 PM   Specimen: Nasopharyngeal Swab; Nasopharyngeal(NP) swabs in vial transport medium  Result Value Ref Range Status   SARS Coronavirus 2 by RT PCR NEGATIVE NEGATIVE Final    Comment: (NOTE) SARS-CoV-2 target nucleic acids are NOT DETECTED.  The SARS-CoV-2 RNA is generally detectable in upper respiratory specimens during the acute phase of infection. The lowest concentration of SARS-CoV-2 viral copies this assay can detect is 138 copies/mL. A negative  result does not preclude SARS-Cov-2 infection and should not be used as the sole basis for treatment or other patient management decisions. A negative result may occur with  improper specimen collection/handling, submission of specimen other than nasopharyngeal swab, presence of viral mutation(s) within the areas targeted by this assay, and inadequate number of viral copies(<138 copies/mL). A negative result must be combined with clinical observations, patient history, and epidemiological information. The expected result is Negative.  Fact Sheet for Patients:  EntrepreneurPulse.com.au  Fact Sheet for Healthcare Providers:  IncredibleEmployment.be  This test is no t yet approved or cleared by the [place] FDA and  has been authorized for detection and/or diagnosis of SARS-CoV-2 by FDA under an Emergency Use Authorization (EUA). This EUA will remain  in effect (meaning this test can be used) for the duration of the COVID-19 declaration under Section 564(b)(1) of the Act, 21 U.S.C.section 360bbb-3(b)(1), unless the authorization is terminated  or revoked sooner.       Influenza A by PCR NEGATIVE NEGATIVE Final   Influenza B by PCR NEGATIVE NEGATIVE Final    Comment: (NOTE) The Xpert Xpress SARS-CoV-2/FLU/RSV plus assay is intended as an aid in the diagnosis of influenza  from Nasopharyngeal swab specimens and should not be used as a sole basis for treatment. Nasal washings and aspirates are unacceptable for Xpert Xpress SARS-CoV-2/FLU/RSV testing.  Fact Sheet for Patients: EntrepreneurPulse.com.au  Fact Sheet for Healthcare Providers: IncredibleEmployment.be  This test is not yet approved or cleared by the [place] FDA and has been authorized for detection and/or diagnosis of SARS-CoV-2 by FDA under an Emergency Use Authorization (EUA). This EUA will remain in effect (meaning this test can be used)  for the duration of the COVID-19 declaration under Section 564(b)(1) of the Act, 21 U.S.C. section 360bbb-3(b)(1), unless the authorization is terminated or revoked.  Performed at [facility], [place] [address] [place], [place] [address]   MRSA PCR Screening     Status: None   Collection Time: 01/07/21 12:20 AM   Specimen: Nasal Mucosa; Nasopharyngeal  Result Value Ref Range Status   MRSA by PCR NEGATIVE NEGATIVE Final    Comment:        The GeneXpert MRSA Assay (FDA approved for NASAL specimens only), is one component of a comprehensive MRSA colonization surveillance program. It is not intended to diagnose MRSA infection nor to guide or monitor treatment for MRSA infections. Performed at [facility], [place] [address], [place], [place] [address]     Time coordinating discharge: Approximately 40 minutes  Patrecia Pour, MD  [facility] 01/08/2021, 1:14 PM

## 2021-01-08 NOTE — Progress Notes (Signed)
See colonoscopy report for details.  Poor preparation, very limited mucosal visualization.  Cecum was reached.  No melena or bright red blood, so no current active bleeding, and hemoglobin stable.  Source unknown, could have been right colon source obscured by poor prep, seems more likely a small bowel source such as ulcer from recent heavy aspirin use.  If so, would be a more distal small bowel ulcer beyond the reach of yesterday's upper endoscopy.  I offered the patient a repeat bowel preparation today for another colonoscopy tomorrow and, if no source found on that, small bowel video capsule study.  She would prefer to go home, understanding the risks of the uncertainty of her bleeding source at present and risk of rebleeding.  She would come back if she had recurrent bleeding and will certainly stay away from aspirin.  I will alert the [facility] physician.   - Wilfrid Lund, MD    [facility]

## 2021-01-08 NOTE — Interval H&P Note (Signed)
History and Physical Interval Note:  01/08/2021 11:43 AM  Mia Todd  has presented today for surgery, with the diagnosis of GI bleed.  Tarry stools.  Blood loss anemia.  Unrevealing EGD..  The various methods of treatment have been discussed with the patient and family. After consideration of risks, benefits and other options for treatment, the patient has consented to  Procedure(s): COLONOSCOPY WITH PROPOFOL (N/A) as a surgical intervention.  The patient's history has been reviewed, patient examined, no change in status, stable for surgery.  I have reviewed the patient's chart and labs.  Questions were answered to the patient's satisfaction.    Hgb stable today.  Patient looks well  [place]

## 2021-01-08 NOTE — Transfer of Care (Signed)
Immediate Anesthesia Transfer of Care Note  Patient: Mia Todd  Procedure(s) Performed: COLONOSCOPY WITH PROPOFOL (N/A )  Patient Location: PACU  Anesthesia Type:MAC  Level of Consciousness: awake, alert  and oriented  Airway & Oxygen Therapy: Patient Spontanous Breathing  Post-op Assessment: Report given to RN and Post -op Vital signs reviewed and stable  Post vital signs: Reviewed and stable  Last Vitals:  Vitals Value Taken Time  BP 133/74 01/08/21 1222  Temp 37 C 01/08/21 1222  Pulse 75 01/08/21 1232  Resp 17 01/08/21 1232  SpO2 99 % 01/08/21 1232  Vitals shown include unvalidated device data.  Last Pain:  Vitals:   01/08/21 1222  TempSrc:   PainSc: 0-No pain         Complications: No complications documented.

## 2021-01-08 NOTE — Anesthesia Procedure Notes (Signed)
Procedure Name: MAC Date/Time: 01/08/2021 11:45 AM Performed by: Eligha Bridegroom, CRNA Pre-anesthesia Checklist: Patient identified, Emergency Drugs available, Suction available, Patient being monitored and Timeout performed Patient Re-evaluated:Patient Re-evaluated prior to induction Oxygen Delivery Method: Nasal cannula Preoxygenation: Pre-oxygenation with 100% oxygen Induction Type: IV induction

## 2021-01-08 NOTE — Plan of Care (Signed)
Discharge instruction given to the patient and she stated understanding. Questions and concerns answered. IV and cardiac monitor discontinued. Patient waiting for her ride.  1549: Patient discharged via wheel chair. Patient hemodynamically stable during discharge.  Problem: Education: Goal: Knowledge of General Education information will improve Description: Including pain rating scale, medication(s)/side effects and non-pharmacologic comfort measures Outcome: Adequate for Discharge   Problem: Health Behavior/Discharge Planning: Goal: Ability to manage health-related needs will improve Outcome: Adequate for Discharge   Problem: Clinical Measurements: Goal: Ability to maintain clinical measurements within normal limits will improve Outcome: Adequate for Discharge Goal: Will remain free from infection Outcome: Adequate for Discharge Goal: Diagnostic test results will improve Outcome: Adequate for Discharge Goal: Respiratory complications will improve Outcome: Adequate for Discharge Goal: Cardiovascular complication will be avoided Outcome: Adequate for Discharge   Problem: Activity: Goal: Risk for activity intolerance will decrease Outcome: Adequate for Discharge   Problem: Nutrition: Goal: Adequate nutrition will be maintained Outcome: Adequate for Discharge   Problem: Coping: Goal: Level of anxiety will decrease Outcome: Adequate for Discharge   Problem: Elimination: Goal: Will not experience complications related to bowel motility Outcome: Adequate for Discharge Goal: Will not experience complications related to urinary retention Outcome: Adequate for Discharge   Problem: Pain Managment: Goal: General experience of comfort will improve Outcome: Adequate for Discharge   Problem: Safety: Goal: Ability to remain free from injury will improve Outcome: Adequate for Discharge   Problem: Skin Integrity: Goal: Risk for impaired skin integrity will decrease Outcome:  Adequate for Discharge   Problem: Education: Goal: Ability to identify signs and symptoms of gastrointestinal bleeding will improve Outcome: Adequate for Discharge   Problem: Bowel/Gastric: Goal: Will show no signs and symptoms of gastrointestinal bleeding Outcome: Adequate for Discharge   Problem: Fluid Volume: Goal: Will show no signs and symptoms of excessive bleeding Outcome: Adequate for Discharge   Problem: Clinical Measurements: Goal: Complications related to the disease process, condition or treatment will be avoided or minimized Outcome: Adequate for Discharge

## 2021-01-10 ENCOUNTER — Encounter (HOSPITAL_COMMUNITY): Payer: Self-pay | Admitting: Gastroenterology

## 2021-01-11 LAB — SURGICAL PATHOLOGY

## 2021-01-11 LAB — GLUCOSE, CAPILLARY: Glucose-Capillary: 88 mg/dL (ref 70–99)

## 2021-01-11 NOTE — Anesthesia Postprocedure Evaluation (Signed)
Anesthesia Post Note  Patient: Mia Todd  Procedure(s) Performed: ESOPHAGOGASTRODUODENOSCOPY (EGD) WITH PROPOFOL (N/A ) BIOPSY     Patient location during evaluation: PACU Anesthesia Type: MAC Level of consciousness: awake and alert Pain management: pain level controlled Vital Signs Assessment: post-procedure vital signs reviewed and stable Respiratory status: spontaneous breathing, nonlabored ventilation, respiratory function stable and patient connected to nasal cannula oxygen Cardiovascular status: stable and blood pressure returned to baseline Postop Assessment: no apparent nausea or vomiting Anesthetic complications: no   No complications documented.              Effie Berkshire

## 2021-01-12 LAB — METHYLMALONIC ACID, SERUM: Methylmalonic Acid, Quantitative: 189 nmol/L (ref 0–378)

## 2021-11-21 ENCOUNTER — Other Ambulatory Visit: Payer: Self-pay | Admitting: Family Medicine

## 2021-11-21 ENCOUNTER — Ambulatory Visit
Admission: RE | Admit: 2021-11-21 | Discharge: 2021-11-21 | Disposition: A | Discharge status: Home / Self Care | Payer: Medicare Other | Attending: Family Medicine | Admitting: Family Medicine

## 2021-11-21 ENCOUNTER — Ambulatory Visit
Admission: RE | Admit: 2021-11-21 | Discharge: 2021-11-21 | Disposition: A | Discharge status: Home / Self Care | Payer: Medicare Other | Source: Ambulatory Visit | Attending: Family Medicine | Admitting: Family Medicine

## 2021-11-21 DIAGNOSIS — M5416 Radiculopathy, lumbar region: Secondary | ICD-10-CM | POA: Insufficient documentation

## 2021-11-21 DIAGNOSIS — G8929 Other chronic pain: Secondary | ICD-10-CM

## 2021-11-21 DIAGNOSIS — M545 Low back pain, unspecified: Secondary | ICD-10-CM | POA: Diagnosis present

## 2022-02-05 ENCOUNTER — Encounter: Payer: Self-pay | Admitting: Emergency Medicine

## 2022-02-05 ENCOUNTER — Ambulatory Visit
Admission: EM | Admit: 2022-02-05 | Discharge: 2022-02-05 | Disposition: A | Discharge status: Home / Self Care | Payer: Medicare Other | Attending: Internal Medicine | Admitting: Internal Medicine

## 2022-02-05 DIAGNOSIS — N3001 Acute cystitis with hematuria: Secondary | ICD-10-CM | POA: Diagnosis present

## 2022-02-05 LAB — URINALYSIS, MICROSCOPIC (REFLEX): WBC, UA: >50 WBC/hpf (ref 0–5)

## 2022-02-05 LAB — URINALYSIS, ROUTINE W REFLEX MICROSCOPIC
Bilirubin Urine: NEGATIVE
Glucose, UA: 100 mg/dL — AB
Hgb urine dipstick: NEGATIVE
Ketones, ur: NEGATIVE mg/dL
Nitrite: POSITIVE — AB
Protein, ur: NEGATIVE mg/dL
Specific Gravity, Urine: 1.01 (ref 1.005–1.030)
pH: 5.5 (ref 5.0–8.0)

## 2022-02-05 MED ORDER — NITROFURANTOIN MONOHYD MACRO 100 MG PO CAPS: 100.0000 mg | ORAL_CAPSULE | Freq: Two times a day (BID) | ORAL | 0 refills | Status: AC

## 2022-02-07 LAB — URINE CULTURE: Culture: >=100000 — AB

## 2022-02-08 ENCOUNTER — Telehealth (HOSPITAL_COMMUNITY): Payer: Self-pay | Admitting: Emergency Medicine

## 2022-02-08 MED ORDER — CEPHALEXIN 500 MG PO CAPS: 500.0000 mg | ORAL_CAPSULE | Freq: Two times a day (BID) | ORAL | 0 refills | Status: AC

## 2022-04-03 ENCOUNTER — Ambulatory Visit
Admission: EM | Admit: 2022-04-03 | Discharge: 2022-04-03 | Disposition: A | Discharge status: Home / Self Care | Payer: Medicare Other | Attending: Emergency Medicine | Admitting: Emergency Medicine

## 2022-04-03 DIAGNOSIS — N390 Urinary tract infection, site not specified: Secondary | ICD-10-CM | POA: Diagnosis present

## 2022-04-03 LAB — URINALYSIS, ROUTINE W REFLEX MICROSCOPIC
Bilirubin Urine: NEGATIVE
Glucose, UA: NEGATIVE mg/dL
Hgb urine dipstick: NEGATIVE
Ketones, ur: NEGATIVE mg/dL
Nitrite: POSITIVE — AB
Protein, ur: NEGATIVE mg/dL
Specific Gravity, Urine: <1.005 — ABNORMAL LOW (ref 1.005–1.030)
pH: 5.5 (ref 5.0–8.0)

## 2022-04-03 LAB — URINALYSIS, MICROSCOPIC (REFLEX)

## 2022-04-03 MED ORDER — CEFTRIAXONE SODIUM 1 G IJ SOLR
1.0000 g | Freq: Once | INTRAMUSCULAR | Status: AC
  Administered 2022-04-03: 1 g via INTRAMUSCULAR

## 2022-04-03 MED ORDER — CEPHALEXIN 500 MG PO CAPS: 500.0000 mg | ORAL_CAPSULE | Freq: Four times a day (QID) | ORAL | 0 refills | Status: AC

## 2022-04-03 NOTE — Discharge Instructions (Addendum)
I am concerned that you have a complicated urinary tract infection.  I have sent your urine off for culture to make sure that we have you on the right antibiotic.  I am giving you a gram of Rocephin to start things off.  Finish Keflex, even if you feel better.  Take the Pyridium as needed.  Continue pushing fluids.  Go immediately to the ER for fevers, worsening back pain, vomiting, or for other concerns.

## 2022-04-03 NOTE — ED Triage Notes (Signed)
Pt states she was seen last month for UTI, states she noticed odor to urine x2 weeks ago, burning pain in lower abdomen

## 2022-04-03 NOTE — ED Provider Notes (Signed)
HPI  SUBJECTIVE:  Mia Todd is a 76 y.o. female who presents with several weeks of urinary frequency, odorous urine, burning, throbbing, suprapubic/bladder pain.  She reports hematuria 1 week ago, but this has resolved.  No dysuria, urgency, cloudy urine, nausea, vomiting, fevers, other abdominal pain.  No change in her baseline back pain.  She does report nonmigratory, nonradiating dull, throbbing, constant right flank pain that has not moved since it started.  No vaginal odor, bleeding, discharge, itching, irritation.  She is not sexually active.  It is worse in the morning, but gets better during the day.  She tried pushing fluids, drinking parsley tea with improvement in her symptoms.  Symptoms worse when she does not drink enough water.  No antibiotics in the past month.  No antipyretic in the past 6 hours.  She has a past medical history of recurrent UTI, but has not followed by urology.  She also has history of pyelonephritis, diabetes, hypertension, yeast infections, hyperlipidemia.  No history of bleeding dysuria.  She states her glucose has been labile recently.  She denies a history of chronic kidney disease although this is listed in her chart.  PCP: [facility].  She was seen here in June for similar symptoms, found to have a Klebsiella UTI and was started on Macrobid.   Past Medical History:  Diagnosis Date   Chronic kidney disease    DDD (degenerative disc disease), lumbar    Diabetes mellitus without complication ([place])    Diverticulitis    GERD (gastroesophageal reflux disease)    Hyperlipidemia    Hypertension    Recurrent UTI     Past Surgical History:  Procedure Laterality Date   BACK SURGERY     BIOPSY  01/07/2021   Procedure: BIOPSY;  Surgeon: Doran Stabler, MD;  Location: [facility];  Service: Gastroenterology;;   BREAST CYST ASPIRATION Left 1974   neg   COLONOSCOPY WITH PROPOFOL N/A 03/05/2018   Procedure: COLONOSCOPY WITH PROPOFOL;  Surgeon:  Lollie Sails, MD;  Location: [facility] ENDOSCOPY;  Service: Endoscopy;  Laterality: N/A;   COLONOSCOPY WITH PROPOFOL N/A 01/08/2021   Procedure: COLONOSCOPY WITH PROPOFOL;  Surgeon: Doran Stabler, MD;  Location: [place];  Service: Gastroenterology;  Laterality: N/A;   DILATION AND CURETTAGE OF UTERUS     ESOPHAGOGASTRODUODENOSCOPY (EGD) WITH PROPOFOL N/A 01/07/2021   Procedure: ESOPHAGOGASTRODUODENOSCOPY (EGD) WITH PROPOFOL;  Surgeon: Doran Stabler, MD;  Location: [facility];  Service: Gastroenterology;  Laterality: N/A;   LUMBAR FUSION     REMOVE OVARIAN CYST     SPINAL FUSION     TUBAL LIGATION      Family History  Problem Relation Age of Onset   Breast cancer Neg Hx     Social History   Tobacco Use   Smoking status: Former    Packs/day: 1.00    Years: 15.00    Total pack years: 15.00    Types: Cigarettes    Quit date: 08/15/1991    Years since quitting: 30.6   Smokeless tobacco: Never  Vaping Use   Vaping Use: Never used  Substance Use Topics   Alcohol use: Yes    Comment: 5-7 GLASSES WINE/WEEK   Drug use: Never     Current Facility-Administered Medications:    cefTRIAXone (ROCEPHIN) injection 1 g, 1 g, Intramuscular, Once, Melynda Ripple, MD  Current Outpatient Medications:    amLODipine (NORVASC) 10 MG tablet, Take 10 mg by mouth daily. , Disp: , Rfl:  benazepril (LOTENSIN) 40 MG tablet, Take 40 mg by mouth daily. , Disp: , Rfl:    cephALEXin (KEFLEX) 500 MG capsule, Take 1 capsule (500 mg total) by mouth 4 (four) times daily for 10 days., Disp: 40 capsule, Rfl: 0   gabapentin (NEURONTIN) 300 MG capsule, Take 300 mg by mouth daily as needed (pain)., Disp: , Rfl:    glimepiride (AMARYL) 4 MG tablet, Take 4 mg by mouth daily., Disp: , Rfl:    HUMALOG KWIKPEN 100 UNIT/ML KwikPen, Inject 5 Units into the skin daily as needed (high blood sugar). Sliding scale, Disp: , Rfl:    insulin detemir (LEVEMIR) 100 UNIT/ML FlexPen, Inject 14 Units into the skin  daily., Disp: , Rfl:    metFORMIN (GLUCOPHAGE) 1000 MG tablet, Take 1,000 mg by mouth daily. , Disp: , Rfl:    Multiple Vitamin (MULTIVITAMIN) capsule, Take 1 capsule by mouth daily., Disp: , Rfl:    Omega-3 Fatty Acids (SUPER TWIN EPA/DHA) 1250 MG CAPS, Take 2 capsules by mouth daily., Disp: , Rfl:    PREMARIN vaginal cream, Place 0.5 g vaginally 2 (two) times a week., Disp: , Rfl:    Propylene Glycol-Glycerin (SOOTHE) 0.6-0.6 % SOLN, Place 1 drop into both eyes daily as needed (dry eyes)., Disp: , Rfl:   Allergies  Allergen Reactions   Doxycycline Shortness Of Breath    Difficulty taking deep breaths   Levaquin [Levofloxacin] Shortness Of Breath   Bactrim [Sulfamethoxazole-Trimethoprim]     She does not remember what her reaction is but she knows she is allergic to it.   Ciprofloxacin Other (See Comments)   Penicillins Swelling    Joint swelling   Prednisone     Other reaction(s): Headache, Other (See Comments) Uncontrollable blood sugar - states she did not have this reaction from cortisone injections   Statins Other (See Comments)   Sulfa Antibiotics Other (See Comments)     ROS  As noted in HPI.   Physical Exam  BP (!) 162/76 (BP Location: Left Arm)   Pulse 73   Temp 97.9 F (36.6 C) (Oral)   Ht '5\' 5"'$  (1.651 m)   Wt 58.5 kg   SpO2 99%   BMI 21.47 kg/m   Constitutional: Well developed, well nourished, no acute distress Eyes:  EOMI, conjunctiva normal bilaterally HENT: Normocephalic, atraumatic,mucus membranes moist Respiratory: Normal inspiratory effort Cardiovascular: Normal rate GI: nondistended soft.  Positive right flank tenderness.  No lower abdominal tenderness Back: Positive right CVAT skin: No rash, skin intact Musculoskeletal: no deformities Neurologic: Alert & oriented x 3, no focal neuro deficits Psychiatric: Speech and behavior appropriate   ED Course   Medications  cefTRIAXone (ROCEPHIN) injection 1 g (has no administration in time range)     Orders Placed This Encounter  Procedures   Urine Culture    Standing Status:   Standing    Number of Occurrences:   1    Order Specific Question:   Indication    Answer:   Dysuria   Urinalysis, Routine w reflex microscopic    Standing Status:   Standing    Number of Occurrences:   1   Urinalysis, Microscopic (reflex)    Standing Status:   Standing    Number of Occurrences:   1   Ambulatory referral to Urology    Referral Priority:   Routine    Referral Type:   Consultation    Referral Reason:   Specialty Services Required    Requested Specialty:   Urology  Number of Visits Requested:   1    Results for orders placed or performed during the hospital encounter of 04/03/22 (from the past 24 hour(s))  Urinalysis, Routine w reflex microscopic     Status: Abnormal   Collection Time: 04/03/22 10:50 AM  Result Value Ref Range   Color, Urine YELLOW YELLOW   APPearance CLOUDY (A) CLEAR   Specific Gravity, Urine <1.005 (L) 1.005 - 1.030   pH 5.5 5.0 - 8.0   Glucose, UA NEGATIVE NEGATIVE mg/dL   Hgb urine dipstick NEGATIVE NEGATIVE   Bilirubin Urine NEGATIVE NEGATIVE   Ketones, ur NEGATIVE NEGATIVE mg/dL   Protein, ur NEGATIVE NEGATIVE mg/dL   Nitrite POSITIVE (A) NEGATIVE   Leukocytes,Ua MODERATE (A) NEGATIVE  Urinalysis, Microscopic (reflex)     Status: Abnormal   Collection Time: 04/03/22 10:50 AM  Result Value Ref Range   RBC / HPF 0-5 0 - 5 RBC/hpf   WBC, UA 11-20 0 - 5 WBC/hpf   Bacteria, UA MANY (A) NONE SEEN   Squamous Epithelial / LPF 0-5 0 - 5   No results found.  ED Clinical Impression  1. Complicated urinary tract infection      ED Assessment/Plan  Previous records, labs reviewed.  As noted in HPI.  Patient had a Klebsiella UTI on 02/05/2022 that was resistant to ampicillin and intermediate to Macrobid.  It was otherwise sensitive to all other antibiotics.  She was switched to Keflex 500 twice daily several days later which she tolerated without any  issues.  Patient has positive nitrite, esterase and many bacteria.  Sending urine off for culture to confirm antibiotic choice.  She is allergic to Levaquin, Bactrim, Cipro, sulfa, penicillins, doxycycline.  Calculated creatinine clearance 57 mL/min from labs done on 01/10/2022  do not need to renally dose ceftriaxone, Keflex.  Concern for complicated urinary tract infection with the right flank and CVA tenderness.  She has no history of nephrolithiasis, but has history of pyelonephritis.  However, her vitals are normal, she is afebrile, she appears nontoxic.  will try outpatient treatment first.  Giving 1 g of Rocephin here and sending home with Keflex 500 mg 4 times daily for 10 days for complicated UTI.  Follow-up with PCP.  will also put in a referral to urology since she gets frequent UTIs.  ER return precautions given.  Discussed labs,  MDM, treatment plan, and plan for follow-up with patient. Discussed sn/sx that should prompt return to the ED. patient agrees with plan.   Meds ordered this encounter  Medications   cefTRIAXone (ROCEPHIN) injection 1 g   cephALEXin (KEFLEX) 500 MG capsule    Sig: Take 1 capsule (500 mg total) by mouth 4 (four) times daily for 10 days.    Dispense:  40 capsule    Refill:  0      *This clinic note was created using [employer]. Therefore, there may be occasional mistakes despite careful proofreading.  ?    Melynda Ripple, MD 04/04/22 [id number]

## 2022-04-05 LAB — URINE CULTURE: Culture: >=100000 — AB

## 2022-04-11 ENCOUNTER — Encounter: Payer: Self-pay | Admitting: Urology

## 2022-04-11 ENCOUNTER — Ambulatory Visit: Payer: Medicare Other | Admitting: Urology

## 2022-04-11 VITALS — BP 160/77 | HR 87 | Ht 65.0 in | Wt 129.0 lb

## 2022-04-11 DIAGNOSIS — R339 Retention of urine, unspecified: Secondary | ICD-10-CM | POA: Diagnosis not present

## 2022-04-11 DIAGNOSIS — Z87440 Personal history of urinary (tract) infections: Secondary | ICD-10-CM | POA: Diagnosis not present

## 2022-04-11 DIAGNOSIS — N390 Urinary tract infection, site not specified: Secondary | ICD-10-CM

## 2022-04-11 LAB — BLADDER SCAN AMB NON-IMAGING: Scan Result: 177

## 2022-04-11 MED ORDER — PREMARIN 0.625 MG/GM VA CREA: TOPICAL_CREAM | VAGINAL | 11 refills | Status: AC

## 2022-04-11 NOTE — Progress Notes (Signed)
04/11/2022 3:24 PM   Michele Mcalpine 03/06/1946 [id number]  Referring provider: Melynda Ripple, MD [place],   [id number]  Chief Complaint  Patient presents with   Urinary Tract Infection    HPI: 76 year old female who presents today for further evaluation of recurrent UTIs.  Over the past calendar year, she had 2 documented infections.  One was on 6 10/16/2021 which time she grew Klebsiella pneumonia, resistant to ampicillin, intermediately sensitive to nitrofurantoin but otherwise pansensitive.  She also had a similar appearing urinalysis on 04/03/2022 also growing the same Klebsiella organism.  She is currently on Keflex.  She has 3 additional days left.  Last cross-sectional imaging in the form of CT abdomen pelvis on 12/17/2020 which was unremarkable, no GU pathology.  Notably, she was seen by [facility] urology back in 2014 for the same issues including recurrent UTIs and incomplete bladder emptying.  Work-up at that time included CT urogram as well as cystoscopy which were unremarkable.  She also had urodynamics for elevated PVRs in the mid 100 range, normal compliance with a cystometric capacity of 457 2.  Due to 0 activity is not appreciated.  No reflux eflux.  Personal history of diabetes, hemoglobin A1c of 8.1..  Diverticulosis without diverticulitis.  She does have a tube a primary cream but has not been using this on a regular basis.  She also has some supplements including parsley and cranberry which she sometimes takes but not regularly.  PVR 170  PMH: Past Medical History:  Diagnosis Date   Chronic kidney disease    DDD (degenerative disc disease), lumbar    Diabetes mellitus without complication ([facility])    Diverticulitis    GERD (gastroesophageal reflux disease)    Hyperlipidemia    Hypertension    Recurrent UTI     Surgical History: Past Surgical History:  Procedure Laterality Date   BACK SURGERY     BIOPSY  01/07/2021    Procedure: BIOPSY;  Surgeon: Doran Stabler, MD;  Location: [place];  Service: Gastroenterology;;   BREAST CYST ASPIRATION Left 1974   neg   COLONOSCOPY WITH PROPOFOL N/A 03/05/2018   Procedure: COLONOSCOPY WITH PROPOFOL;  Surgeon: Lollie Sails, MD;  Location: [facility] ENDOSCOPY;  Service: Endoscopy;  Laterality: N/A;   COLONOSCOPY WITH PROPOFOL N/A 01/08/2021   Procedure: COLONOSCOPY WITH PROPOFOL;  Surgeon: Doran Stabler, MD;  Location: [place];  Service: Gastroenterology;  Laterality: N/A;   DILATION AND CURETTAGE OF UTERUS     ESOPHAGOGASTRODUODENOSCOPY (EGD) WITH PROPOFOL N/A 01/07/2021   Procedure: ESOPHAGOGASTRODUODENOSCOPY (EGD) WITH PROPOFOL;  Surgeon: Doran Stabler, MD;  Location: [place];  Service: Gastroenterology;  Laterality: N/A;   LUMBAR FUSION     REMOVE OVARIAN CYST     SPINAL FUSION     TUBAL LIGATION      Home Medications:  Allergies as of 04/11/2022       Reactions   Doxycycline Shortness Of Breath   Difficulty taking deep breaths   Levaquin [levofloxacin] Shortness Of Breath   Bactrim [sulfamethoxazole-trimethoprim]    She does not remember what her reaction is but she knows she is allergic to it.   Ciprofloxacin Other (See Comments)   Penicillins Swelling   Joint swelling   Prednisone    Other reaction(s): Headache, Other (See Comments) Uncontrollable blood sugar - states she did not have this reaction from cortisone injections   Statins Other (See Comments)   Sulfa Antibiotics Other (See Comments)  Medication List        Accurate as of April 11, 2022  3:24 PM. If you have any questions, ask your nurse or doctor.          amLODipine 10 MG tablet Commonly known as: NORVASC Take 10 mg by mouth daily.   benazepril 40 MG tablet Commonly known as: LOTENSIN Take 40 mg by mouth daily.   cephALEXin 500 MG capsule Commonly known as: KEFLEX Take 1 capsule (500 mg total) by mouth 4 (four) times daily for 10 days.    gabapentin 300 MG capsule Commonly known as: NEURONTIN Take 300 mg by mouth daily as needed (pain).   glimepiride 4 MG tablet Commonly known as: AMARYL Take 4 mg by mouth daily.   HumaLOG KwikPen 100 UNIT/ML KwikPen Generic drug: insulin lispro Inject 5 Units into the skin daily as needed (high blood sugar). Sliding scale   insulin detemir 100 UNIT/ML FlexPen Commonly known as: LEVEMIR Inject 14 Units into the skin daily.   metFORMIN 1000 MG tablet Commonly known as: GLUCOPHAGE Take 1,000 mg by mouth daily.   multivitamin capsule Take 1 capsule by mouth daily.   Premarin vaginal cream Generic drug: conjugated estrogens Estrogen Cream Instruction Discard applicator Apply pea sized amount to tip of finger to urethra before bed. Wash hands well after application. Use Monday, Wednesday and Friday Start taking on: April 13, 2022 What changed:  how much to take how to take this when to take this additional instructions These instructions start on April 13, 2022. If you are unsure what to do until then, ask your doctor or other care provider. Changed by: Hollice Espy, MD   Soothe 0.6-0.6 % Soln Generic drug: Propylene Glycol-Glycerin Place 1 drop into both eyes daily as needed (dry eyes).   Super Twin EPA/DHA 1250 MG Caps Take 2 capsules by mouth daily.        Allergies:  Allergies  Allergen Reactions   Doxycycline Shortness Of Breath    Difficulty taking deep breaths   Levaquin [Levofloxacin] Shortness Of Breath   Bactrim [Sulfamethoxazole-Trimethoprim]     She does not remember what her reaction is but she knows she is allergic to it.   Ciprofloxacin Other (See Comments)   Penicillins Swelling    Joint swelling   Prednisone     Other reaction(s): Headache, Other (See Comments) Uncontrollable blood sugar - states she did not have this reaction from cortisone injections   Statins Other (See Comments)   Sulfa Antibiotics Other (See Comments)    Family  History: Family History  Problem Relation Age of Onset   Breast cancer Neg Hx     Social History:  reports that she quit smoking about 30 years ago. Her smoking use included cigarettes. She has a 15.00 pack-year smoking history. She has never used smokeless tobacco. She reports current alcohol use. She reports that she does not use drugs.   Physical Exam: BP (!) 160/77   Pulse 87   Ht '5\' 5"'$  (1.651 m)   Wt 129 lb (58.5 kg)   BMI 21.47 kg/m   Constitutional:  Alert and oriented, No acute distress. HEENT: [place] AT, moist mucus membranes.  Trachea midline, no masses. Cardiovascular: No clubbing, cyanosis, or edema. Respiratory: Normal respiratory effort, no increased work of breathing. GI: Abdomen is soft, nontender, nondistended, no abdominal masses GU: No CVA tenderness Skin: No rashes, bruises or suspicious lesions. Neurologic: Grossly intact, no focal deficits, moving all 4 extremities. Psychiatric: Normal mood and affect.  Laboratory Data: Lab Results  Component Value Date   WBC 7.6 01/08/2021   HGB 10.6 (L) 01/08/2021   HCT 32.9 (L) 01/08/2021   MCV 89.9 01/08/2021   PLT 387 01/08/2021    Lab Results  Component Value Date   CREATININE 0.77 01/07/2021    Lab Results  Component Value Date   HGBA1C 7.4 (H) 01/06/2021    Urinalysis UA today with WBCs and bacteria, currently on antibiotics   Assessment & Plan:    1. Recurrent UTI She has had extensive evaluation in the past and only two document infections over the past year  At this point time, no indication for further evaluation  Would recommend that she resume Premarin cream, pea-sized amount per urethra at least 3 times a week and uses on a consistent basis  In addition to the above, would recommend daily cranberry tablet, probiotic, d-mannose.  Lastly, we discussed our availability for semen next day appointments as needed for any acute cystitis symptoms.  She is agreeable this plan. - Urinalysis,  Complete - BLADDER SCAN AMB NON-IMAGING  2. 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  F/u prn  Hollice Espy, MD  [facility] [address] [place], [place] [address] [phone]  I spent 46 total minutes on the day of the encounter including pre-visit review of the medical record, face-to-face time with the patient, and post visit ordering of labs/imaging/tests.

## 2022-04-11 NOTE — Patient Instructions (Addendum)
Take D-mannose, probiotic, cranberry tablets, and use estrace cream 3 times a week

## 2022-04-12 LAB — URINALYSIS, COMPLETE
Bilirubin, UA: NEGATIVE
Glucose, UA: NEGATIVE
Ketones, UA: NEGATIVE
Nitrite, UA: NEGATIVE
Protein,UA: NEGATIVE
RBC, UA: NEGATIVE
Specific Gravity, UA: <1.005 — ABNORMAL LOW (ref 1.005–1.030)
Urobilinogen, Ur: 0.2 mg/dL (ref 0.2–1.0)
pH, UA: 5 (ref 5.0–7.5)

## 2022-04-12 LAB — MICROSCOPIC EXAMINATION

## 2022-07-26 IMAGING — CR DG HIP (WITH OR WITHOUT PELVIS) 2-3V*L*
5 series · 5 of 5 positions shown · non-contrast
Comparison: None.

CLINICAL DATA: Low back pain left radicular pain

EXAM:
DG HIP (WITH OR WITHOUT PELVIS) 2-3V LEFT

[pelvis ap]
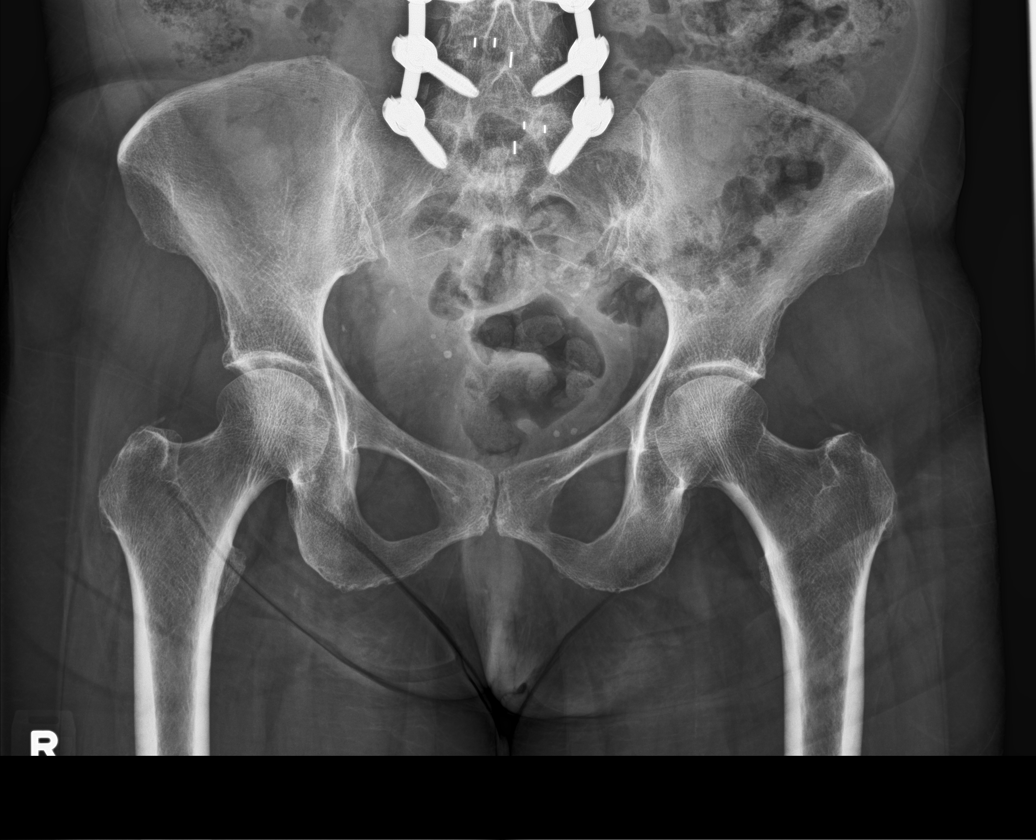

[hip ap (1 of 2)]
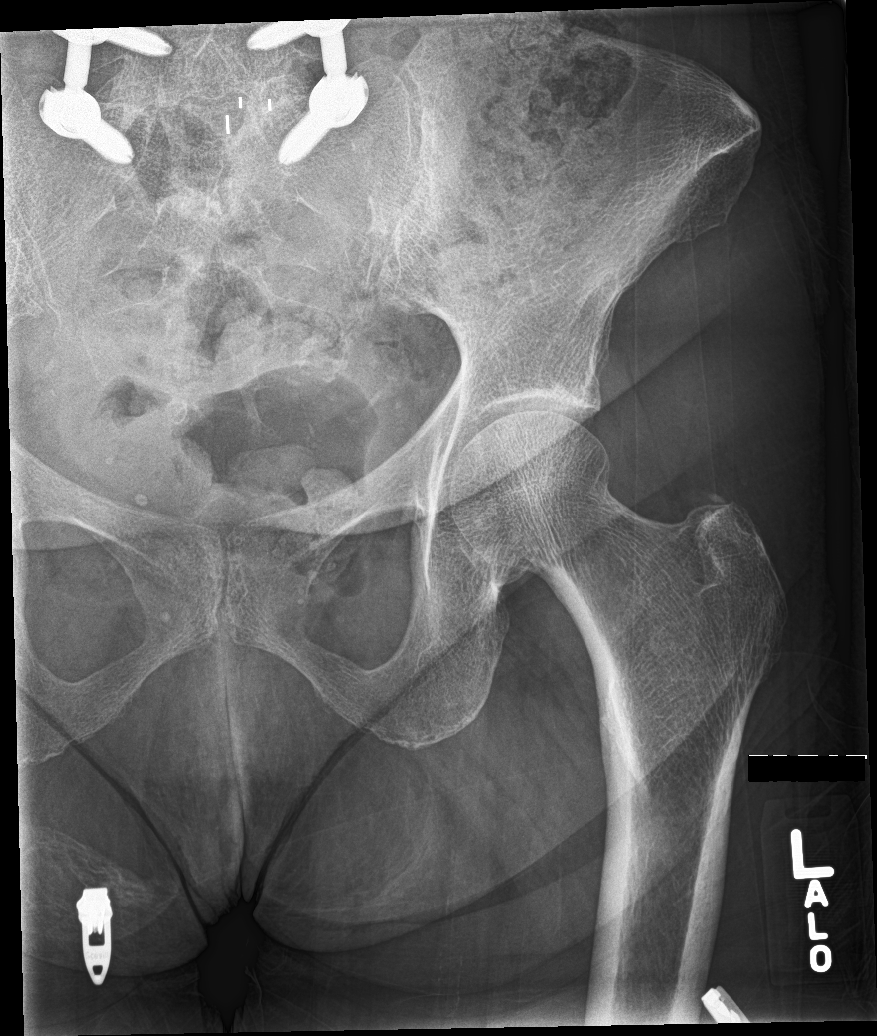

[hip lat (1 of 2)]
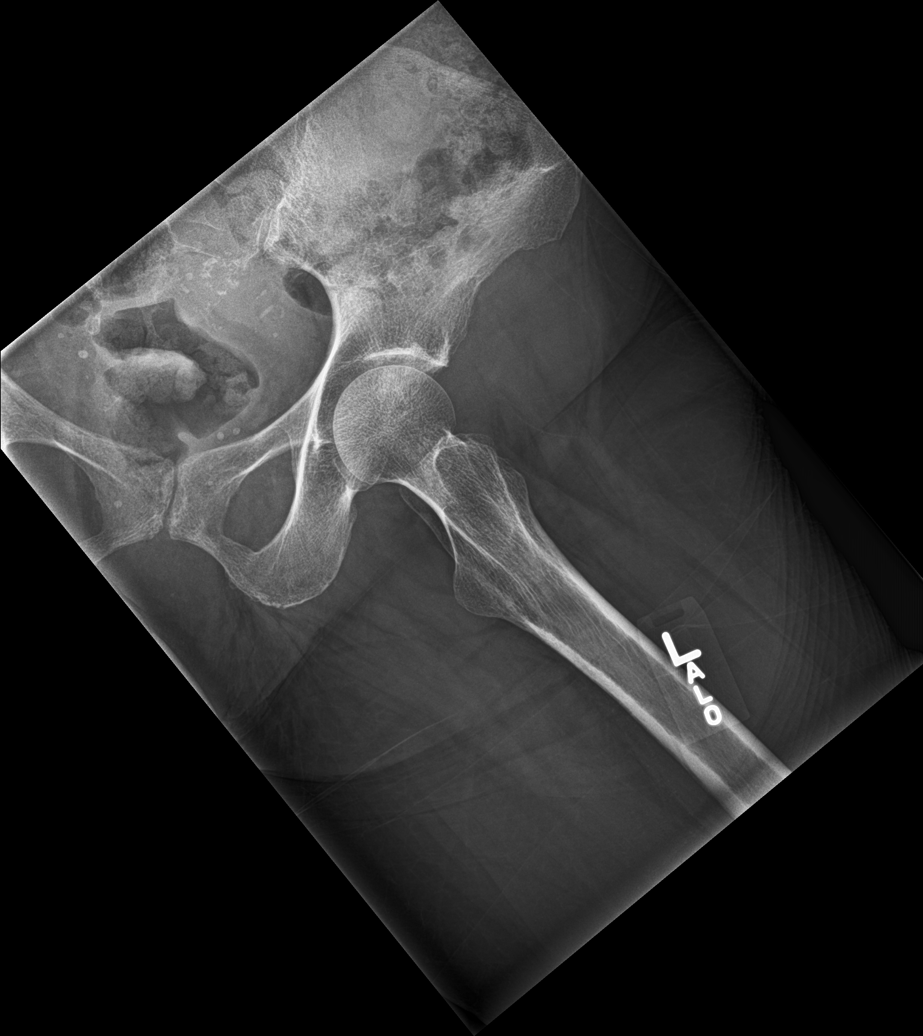

[hip ap (2 of 2)]
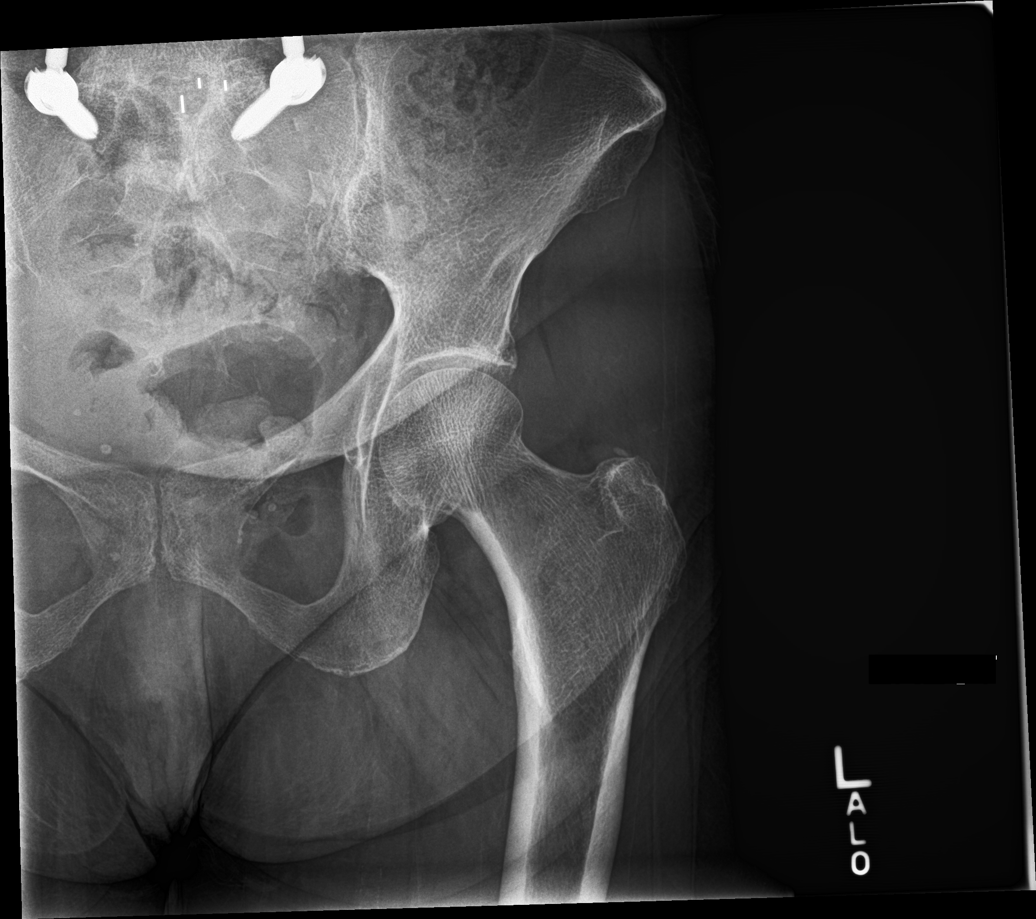

[hip lat (2 of 2)]
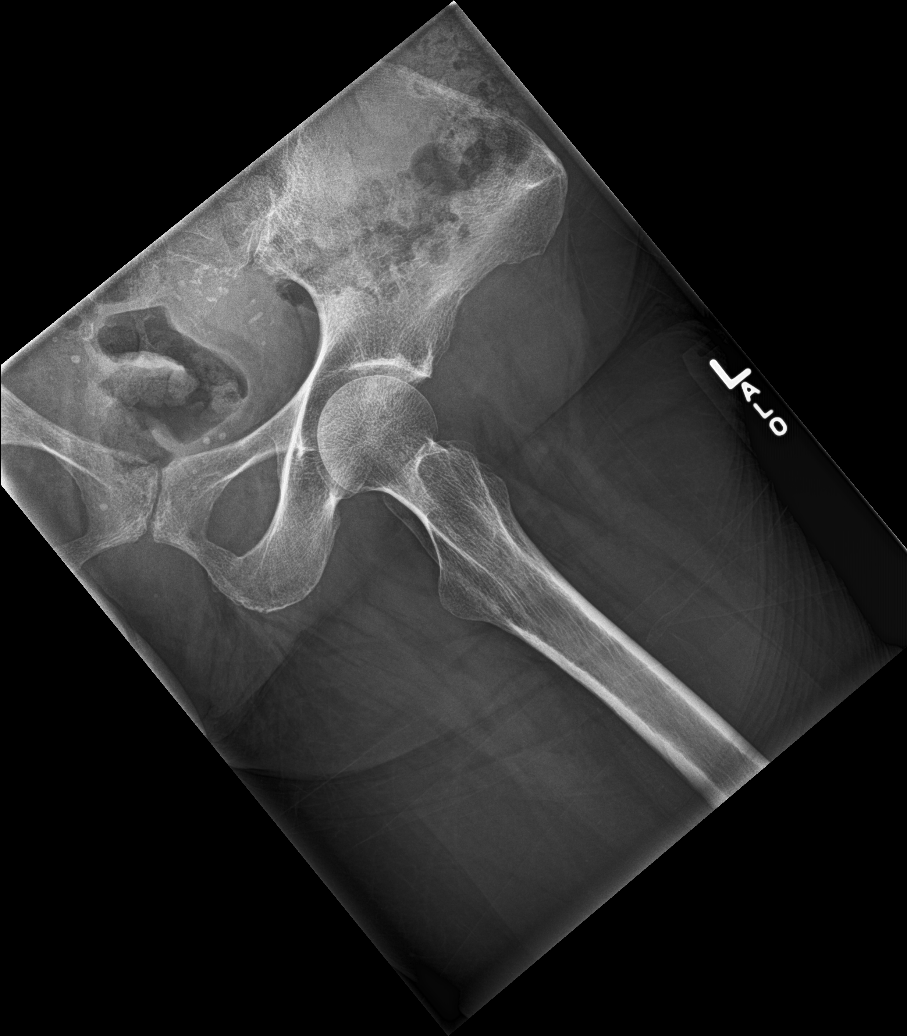

[5 of 5 positions shown; findings below may reference images not displayed]

FINDINGS: No fracture or dislocation is seen in the left hip. There is small
linear calcification in the soft tissues adjacent to the upper
margin of greater trochanter. Similar finding is noted in the soft
tissues adjacent to the greater trochanter of right femur. There is
previous surgical fusion in the lumbosacral spine.
IMPRESSION: No recent fracture or dislocation is seen. Small linear calcific
density in the soft tissues along the upper margin of greater
trochanter of proximal left femur may suggest calcific bursitis or
calcific tendinosis.

## 2023-01-05 ENCOUNTER — Ambulatory Visit: Payer: Self-pay

## 2023-01-05 NOTE — Telephone Encounter (Addendum)
Chief Complaint: left side arms, hans leg and foot intermittent numbness pt stated not a new issue - Symptoms: beginning of call BS was <50, unable to to get out what she wanted to say - pt took 2 glucose tabs and advised pt to take 1 more BS went to 68. Pt rechecked BS with fingerstick and BS was 68  Pt took 2 double dose of insulin this am and had not eaten breakfast. Advised pt to make breakfast add toast and apple juice. Pt made bfast and was eating. Advised pt unable to make appt and after advising [facility] , warm transferred to [facility]. Office [phone]. Pt had blurred vision during episode but cleared up aft er BS rose to normal range.  Frequency: left side numbness pt stated was chronic  Pertinent Negatives: Patient denies weakness or numbness of face. Other areas of numbness are preexisting and chronic Disposition: [] ED /[] Urgent Care (no appt availability in office) / [] Appointment(In office/virtual)/ []  [facility]/ [] Home Care/ [] Refused Recommended Disposition /[] [facility]/ [x]  Follow-up with PCP Additional Notes: Reason for Disposition  Diabetes drug error or overdose (e.g., insulin error or extra dose)  Answer Assessment - Initial Assessment Questions 1. SYMPTOMS: "What symptoms are you concerned about?"     Beginning of phone call difficulty talking  2. ONSET:  "When did the symptoms start?"     This am  3. BLOOD GLUCOSE: "What is your blood glucose level?"      < 50 4. USUAL RANGE: "What is your blood glucose level usually?" (e.g., usual fasting morning value, usual evening value)     N/a 5. TYPE 1 or 2:  "Do you know what type of diabetes you have?"  (e.g., Type 1, Type 2, Gestational; doesn't know)      Type 2  6. INSULIN: "Do you take insulin?" "What type of insulin(s) do you use? What is the mode of delivery? (syringe, pen; injection or pump) "When did you last give yourself an insulin dose?" (i.e., time or hours/minutes ago) "How much did you  give?" (i.e., how many units)     Yes - thinks she took it twice- pen   7. DIABETES PILLS: "Do you take any pills for your diabetes?" If Yes, ask: "What is the name of the medicine(s) that you take for high blood sugar?"     Yes- metformin 8. OTHER SYMPTOMS: "Do you have any symptoms?" (e.g., fever, frequent urination, difficulty breathing, vomiting)     Head is hurting, shaking, vision fuzzy - difficulty with speech - improved once bs went back up then speech was clear  9. LOW BLOOD GLUCOSE TREATMENT: "What have you done so far to treat the low blood glucose level?"     Took 3 tabs  10. FOOD: "When did you last eat or drink?"       Has not eaten  11. ALONE: "Are you alone right now or is someone with you?"        Did not answer 12. PREGNANCY: "Is there any chance you are pregnant?" "When was your last menstrual period?"       N/a  Answer Assessment - Initial Assessment Questions 1. SYMPTOM: "What is the main symptom you are concerned about?" (e.g., weakness, numbness)     *No Answer* 2. ONSET: "When did this start?" (minutes, hours, days; while sleeping)     *No Answer* 3. LAST NORMAL: "When was the last time you (the patient) were normal (no symptoms)?"     *  No Answer* 4. PATTERN "Does this come and go, or has it been constant since it started?"  "Is it present now?"     *No Answer* 5. CARDIAC SYMPTOMS: "Have you had any of the following symptoms: chest pain, difficulty breathing, palpitations?"     *No Answer* 6. NEUROLOGIC SYMPTOMS: "Have you had any of the following symptoms: headache, dizziness, vision loss, double vision, changes in speech, unsteady on your feet?"     *No Answer* 7. OTHER SYMPTOMS: "Do you have any other symptoms?"     *No Answer* 8. PREGNANCY: "Is there any chance you are pregnant?" "When was your last menstrual period?"     *No Answer*  Protocols used: Neurologic Deficit-A-AH, Diabetes - Low Blood Sugar-A-AH

## 2023-01-11 ENCOUNTER — Other Ambulatory Visit: Payer: Self-pay | Admitting: Family Medicine

## 2023-01-11 DIAGNOSIS — R519 Headache, unspecified: Secondary | ICD-10-CM

## 2023-01-11 DIAGNOSIS — D332 Benign neoplasm of brain, unspecified: Secondary | ICD-10-CM

## 2023-01-25 ENCOUNTER — Ambulatory Visit
Admission: RE | Admit: 2023-01-25 | Discharge: 2023-01-25 | Disposition: A | Discharge status: Home / Self Care | Payer: Medicare Other | Source: Ambulatory Visit | Attending: Family Medicine | Admitting: Family Medicine

## 2023-01-25 DIAGNOSIS — D332 Benign neoplasm of brain, unspecified: Secondary | ICD-10-CM

## 2023-01-25 DIAGNOSIS — R519 Headache, unspecified: Secondary | ICD-10-CM

## 2023-01-25 MED ORDER — GADOBUTROL 1 MMOL/ML IV SOLN
5.0000 mL | Freq: Once | INTRAVENOUS | Status: AC | PRN
  Administered 2023-01-25: 6 mL via INTRAVENOUS

## 2023-02-21 ENCOUNTER — Ambulatory Visit: Payer: Medicare Other | Admitting: Family Medicine

## 2023-06-13 ENCOUNTER — Inpatient Hospital Stay: Payer: Medicare Other | Attending: Oncology | Admitting: Oncology

## 2023-06-13 ENCOUNTER — Inpatient Hospital Stay: Payer: Medicare Other

## 2023-06-13 ENCOUNTER — Encounter: Payer: Self-pay | Admitting: Oncology

## 2023-06-13 VITALS — BP 156/74 | HR 70 | Temp 97.7°F | Resp 17 | Ht 64.0 in | Wt 129.8 lb

## 2023-06-13 DIAGNOSIS — R768 Other specified abnormal immunological findings in serum: Secondary | ICD-10-CM | POA: Diagnosis present

## 2023-06-13 DIAGNOSIS — Z87891 Personal history of nicotine dependence: Secondary | ICD-10-CM | POA: Insufficient documentation

## 2023-06-13 LAB — COMPREHENSIVE METABOLIC PANEL
ALT: 17 U/L (ref 0–44)
AST: 21 U/L (ref 15–41)
Albumin: 4.5 g/dL (ref 3.5–5.0)
Alkaline Phosphatase: 56 U/L (ref 38–126)
Anion gap: 10 (ref 5–15)
BUN: 18 mg/dL (ref 8–23)
CO2: 23 mmol/L (ref 22–32)
Calcium: 9.5 mg/dL (ref 8.9–10.3)
Chloride: 102 mmol/L (ref 98–111)
Creatinine, Ser: 0.66 mg/dL (ref 0.44–1.00)
GFR, Estimated: >60 mL/min (ref >60)
Glucose, Bld: 96 mg/dL (ref 70–99)
Potassium: 3.4 mmol/L — ABNORMAL LOW (ref 3.5–5.1)
Sodium: 135 mmol/L (ref 135–145)
Total Bilirubin: 0.7 mg/dL (ref 0.3–1.2)
Total Protein: 8.2 g/dL — ABNORMAL HIGH (ref 6.5–8.1)

## 2023-06-13 LAB — CBC WITH DIFFERENTIAL/PLATELET
Abs Immature Granulocytes: 0.01 10*3/uL (ref 0.00–0.07)
Basophils Absolute: 0 10*3/uL (ref 0.0–0.1)
Basophils Relative: 0 %
Eosinophils Absolute: 0.1 10*3/uL (ref 0.0–0.5)
Eosinophils Relative: 2 %
HCT: 40.9 % (ref 36.0–46.0)
Hemoglobin: 13.3 g/dL (ref 12.0–15.0)
Immature Granulocytes: 0 %
Lymphocytes Relative: 35 %
Lymphs Abs: 1.7 10*3/uL (ref 0.7–4.0)
MCH: 28 pg (ref 26.0–34.0)
MCHC: 32.5 g/dL (ref 30.0–36.0)
MCV: 86.1 fL (ref 80.0–100.0)
Monocytes Absolute: 0.4 10*3/uL (ref 0.1–1.0)
Monocytes Relative: 8 %
Neutro Abs: 2.7 10*3/uL (ref 1.7–7.7)
Neutrophils Relative %: 55 %
Platelets: 353 10*3/uL (ref 150–400)
RBC: 4.75 MIL/uL (ref 3.87–5.11)
RDW: 14.1 % (ref 11.5–15.5)
WBC: 5 10*3/uL (ref 4.0–10.5)
nRBC: 0 % (ref 0.0–0.2)

## 2023-06-15 LAB — KAPPA/LAMBDA LIGHT CHAINS
Kappa free light chain: 35.1 mg/L — ABNORMAL HIGH (ref 3.3–19.4)
Kappa, lambda light chain ratio: 1.88 — ABNORMAL HIGH (ref 0.26–1.65)
Lambda free light chains: 18.7 mg/L (ref 5.7–26.3)

## 2023-06-15 LAB — PROTEIN ELECTRO, RANDOM URINE
Albumin ELP, Urine: 100 %
Alpha-1-Globulin, U: 0 %
Alpha-2-Globulin, U: 0 %
Beta Globulin, U: 0 %
Gamma Globulin, U: 0 %
Total Protein, Urine: 34.3 mg/dL

## 2023-06-19 LAB — MULTIPLE MYELOMA PANEL, SERUM
Albumin SerPl Elph-Mcnc: 4.2 g/dL (ref 2.9–4.4)
Albumin/Glob SerPl: 1.2 (ref 0.7–1.7)
Alpha 1: 0.2 g/dL (ref 0.0–0.4)
Alpha2 Glob SerPl Elph-Mcnc: 0.7 g/dL (ref 0.4–1.0)
B-Globulin SerPl Elph-Mcnc: 1.4 g/dL — ABNORMAL HIGH (ref 0.7–1.3)
Gamma Glob SerPl Elph-Mcnc: 1.2 g/dL (ref 0.4–1.8)
Globulin, Total: 3.6 g/dL (ref 2.2–3.9)
IgA: 415 mg/dL (ref 64–422)
IgG (Immunoglobin G), Serum: 1240 mg/dL (ref 586–1602)
IgM (Immunoglobulin M), Srm: 78 mg/dL (ref 26–217)
Total Protein ELP: 7.8 g/dL (ref 6.0–8.5)

## 2023-06-22 ENCOUNTER — Observation Stay
Admission: EM | Admit: 2023-06-22 | Discharge: 2023-06-23 | Disposition: A | Discharge status: Home / Self Care | Payer: Medicare Other | Attending: Family Medicine | Admitting: Family Medicine

## 2023-06-22 ENCOUNTER — Encounter: Payer: Self-pay | Admitting: Internal Medicine

## 2023-06-22 ENCOUNTER — Emergency Department: Payer: Medicare Other

## 2023-06-22 ENCOUNTER — Other Ambulatory Visit: Payer: Self-pay

## 2023-06-22 ENCOUNTER — Observation Stay: Payer: Medicare Other

## 2023-06-22 DIAGNOSIS — I10 Essential (primary) hypertension: Secondary | ICD-10-CM | POA: Diagnosis present

## 2023-06-22 DIAGNOSIS — E119 Type 2 diabetes mellitus without complications: Secondary | ICD-10-CM

## 2023-06-22 DIAGNOSIS — G930 Cerebral cysts: Secondary | ICD-10-CM | POA: Diagnosis not present

## 2023-06-22 DIAGNOSIS — R779 Abnormality of plasma protein, unspecified: Secondary | ICD-10-CM | POA: Diagnosis not present

## 2023-06-22 DIAGNOSIS — Z794 Long term (current) use of insulin: Secondary | ICD-10-CM | POA: Diagnosis not present

## 2023-06-22 DIAGNOSIS — G459 Transient cerebral ischemic attack, unspecified: Principal | ICD-10-CM | POA: Diagnosis present

## 2023-06-22 DIAGNOSIS — Z79899 Other long term (current) drug therapy: Secondary | ICD-10-CM | POA: Diagnosis not present

## 2023-06-22 DIAGNOSIS — N189 Chronic kidney disease, unspecified: Secondary | ICD-10-CM | POA: Diagnosis not present

## 2023-06-22 DIAGNOSIS — E1122 Type 2 diabetes mellitus with diabetic chronic kidney disease: Secondary | ICD-10-CM | POA: Insufficient documentation

## 2023-06-22 DIAGNOSIS — Z7982 Long term (current) use of aspirin: Secondary | ICD-10-CM | POA: Insufficient documentation

## 2023-06-22 DIAGNOSIS — I129 Hypertensive chronic kidney disease with stage 1 through stage 4 chronic kidney disease, or unspecified chronic kidney disease: Secondary | ICD-10-CM | POA: Insufficient documentation

## 2023-06-22 DIAGNOSIS — D330 Benign neoplasm of brain, supratentorial: Secondary | ICD-10-CM

## 2023-06-22 DIAGNOSIS — Z87891 Personal history of nicotine dependence: Secondary | ICD-10-CM | POA: Insufficient documentation

## 2023-06-22 DIAGNOSIS — R29810 Facial weakness: Secondary | ICD-10-CM | POA: Diagnosis present

## 2023-06-22 DIAGNOSIS — Z8719 Personal history of other diseases of the digestive system: Secondary | ICD-10-CM

## 2023-06-22 LAB — COMPREHENSIVE METABOLIC PANEL
ALT: 19 U/L (ref 0–44)
AST: 20 U/L (ref 15–41)
Albumin: 4.7 g/dL (ref 3.5–5.0)
Alkaline Phosphatase: 62 U/L (ref 38–126)
Anion gap: 14 (ref 5–15)
BUN: 17 mg/dL (ref 8–23)
CO2: 19 mmol/L — ABNORMAL LOW (ref 22–32)
Calcium: 9.6 mg/dL (ref 8.9–10.3)
Chloride: 103 mmol/L (ref 98–111)
Creatinine, Ser: 0.67 mg/dL (ref 0.44–1.00)
GFR, Estimated: >60 mL/min (ref >60)
Glucose, Bld: 99 mg/dL (ref 70–99)
Potassium: 3.6 mmol/L (ref 3.5–5.1)
Sodium: 136 mmol/L (ref 135–145)
Total Bilirubin: 0.6 mg/dL (ref <1.2)
Total Protein: 8.9 g/dL — ABNORMAL HIGH (ref 6.5–8.1)

## 2023-06-22 LAB — DIFFERENTIAL
Abs Immature Granulocytes: 0.02 10*3/uL (ref 0.00–0.07)
Basophils Absolute: 0 10*3/uL (ref 0.0–0.1)
Basophils Relative: 0 %
Eosinophils Absolute: 0.1 10*3/uL (ref 0.0–0.5)
Eosinophils Relative: 2 %
Immature Granulocytes: 0 %
Lymphocytes Relative: 30 %
Lymphs Abs: 1.9 10*3/uL (ref 0.7–4.0)
Monocytes Absolute: 0.5 10*3/uL (ref 0.1–1.0)
Monocytes Relative: 9 %
Neutro Abs: 3.7 10*3/uL (ref 1.7–7.7)
Neutrophils Relative %: 59 %

## 2023-06-22 LAB — CBC
HCT: 45.5 % (ref 36.0–46.0)
Hemoglobin: 15.1 g/dL — ABNORMAL HIGH (ref 12.0–15.0)
MCH: 28 pg (ref 26.0–34.0)
MCHC: 33.2 g/dL (ref 30.0–36.0)
MCV: 84.3 fL (ref 80.0–100.0)
Platelets: 363 10*3/uL (ref 150–400)
RBC: 5.4 MIL/uL — ABNORMAL HIGH (ref 3.87–5.11)
RDW: 13.7 % (ref 11.5–15.5)
WBC: 6.3 10*3/uL (ref 4.0–10.5)
nRBC: 0 % (ref 0.0–0.2)

## 2023-06-22 LAB — PROTIME-INR
INR: 0.9 (ref 0.8–1.2)
Prothrombin Time: 12.4 s (ref 11.4–15.2)

## 2023-06-22 LAB — CBG MONITORING, ED: Glucose-Capillary: 98 mg/dL (ref 70–99)

## 2023-06-22 LAB — ETHANOL: Alcohol, Ethyl (B): <10 mg/dL (ref <10)

## 2023-06-22 LAB — APTT: aPTT: 24 s (ref 24–36)

## 2023-06-22 MED ORDER — ACETAMINOPHEN 650 MG RE SUPP: 650.0000 mg | RECTAL | Status: DC | PRN

## 2023-06-22 MED ORDER — ACETAMINOPHEN 325 MG PO TABS: 650.0000 mg | ORAL_TABLET | ORAL | Status: DC | PRN

## 2023-06-22 MED ORDER — ACETAMINOPHEN 160 MG/5ML PO SOLN: 650.0000 mg | ORAL | Status: DC | PRN

## 2023-06-22 MED ORDER — INSULIN ASPART 100 UNIT/ML IJ SOLN: 0.0000 [IU] | Freq: Every day | INTRAMUSCULAR | Status: DC

## 2023-06-22 MED ORDER — STROKE: EARLY STAGES OF RECOVERY BOOK: Freq: Once | Status: AC

## 2023-06-22 MED ORDER — ENOXAPARIN SODIUM 40 MG/0.4ML IJ SOSY: 40.0000 mg | PREFILLED_SYRINGE | INTRAMUSCULAR | Status: DC

## 2023-06-22 MED ORDER — INSULIN DETEMIR 100 UNIT/ML ~~LOC~~ SOLN
10.0000 [IU] | Freq: Every day | SUBCUTANEOUS | Status: DC
  Administered 2023-06-23: 10 [IU] via SUBCUTANEOUS
  Filled 2023-06-22: qty 0.1

## 2023-06-22 MED ORDER — INSULIN ASPART 100 UNIT/ML IJ SOLN
0.0000 [IU] | Freq: Three times a day (TID) | INTRAMUSCULAR | Status: DC
  Administered 2023-06-23 (×2): 3 [IU] via SUBCUTANEOUS
  Administered 2023-06-23: 2 [IU] via SUBCUTANEOUS
  Filled 2023-06-22 (×3): qty 1

## 2023-06-22 NOTE — H&P (Signed)
History and Physical    Patient: Mia Todd UJW:[id number] DOB: 11/12/1945 DOA: 06/22/2023 DOS: the patient was seen and examined on 06/22/2023 PCP: Ardyth Man, PA-C  Patient coming from: Home  Chief Complaint:  Chief Complaint  Patient presents with   Facial Droop    HPI: Mia Todd is a 77 y.o. female with medical history significant for Insulin-dependent type 2 diabetes, HTN, upper GI bleed from gastropathy in 2022 (negative biopsy), known occipital lobe cyst, recent elevated immunoglobulin free light chain, currently undergoing workup by oncology, who presents to the ED with strokelike symptoms.  Patient was driving driving when she felt the right side of her face go numb and on looking in the mirror she noticed that she had a right facial droop and she also felt like her speech was slurred.  She still noted numbness in her right arm.  Symptoms lasted 3 minutes before resolving.  Patient called her PCP and she was instructed to present to the ED. she was asymptomatic upon arrival ED course and data review: Vitals: SBP 140s to 160s but otherwise normal Labs CBC, CMP, EtOH and INR all unremarkableEKG, Personally viewed and interpreted showed sinus at 73 with no concerning findings CT head: Preliminary read by ED provider with no acute findings IMPRESSION: 1. No acute intracranial abnormality. 2. Benign appearing cystic lesion measuring 3.7 cm at the left occipital lobe, relatively stable from prior MRI. Patient did arrive with an tPA window but symptoms were completely resolved by arrival for an NIH of 0.  Code stroke was not activated  Hospitalist consulted for admission prior to official CT read due to prolonged delays with read..     Past Medical History:  Diagnosis Date   Chronic kidney disease    DDD (degenerative disc disease), lumbar    Diabetes mellitus without complication (HCC)    Diverticulitis    GERD (gastroesophageal reflux disease)    Hyperlipidemia     Hypertension    Recurrent UTI    Past Surgical History:  Procedure Laterality Date   BACK SURGERY     BIOPSY  01/07/2021   Procedure: BIOPSY;  Surgeon: Sherrilyn Rist, MD;  Location: [facility] ENDOSCOPY;  Service: Gastroenterology;;   BREAST CYST ASPIRATION Left 1974   neg   COLONOSCOPY WITH PROPOFOL N/A 03/05/2018   Procedure: COLONOSCOPY WITH PROPOFOL;  Surgeon: Christena Deem, MD;  Location: [facility] ENDOSCOPY;  Service: Endoscopy;  Laterality: N/A;   COLONOSCOPY WITH PROPOFOL N/A 01/08/2021   Procedure: COLONOSCOPY WITH PROPOFOL;  Surgeon: Sherrilyn Rist, MD;  Location: [facility] ENDOSCOPY;  Service: Gastroenterology;  Laterality: N/A;   DILATION AND CURETTAGE OF UTERUS     ESOPHAGOGASTRODUODENOSCOPY (EGD) WITH PROPOFOL N/A 01/07/2021   Procedure: ESOPHAGOGASTRODUODENOSCOPY (EGD) WITH PROPOFOL;  Surgeon: Sherrilyn Rist, MD;  Location: [facility] ENDOSCOPY;  Service: Gastroenterology;  Laterality: N/A;   LUMBAR FUSION     REMOVE OVARIAN CYST     SPINAL FUSION     TUBAL LIGATION     Social History:  reports that she quit smoking about 31 years ago. Her smoking use included cigarettes. She started smoking about 46 years ago. She has a 15 pack-year smoking history. She has never used smokeless tobacco. She reports current alcohol use of about 1.0 standard drink of alcohol per week. She reports that she does not use drugs.  Allergies  Allergen Reactions   Doxycycline Shortness Of Breath    Difficulty taking deep breaths   Levaquin [Levofloxacin] Shortness Of Breath  Bactrim [Sulfamethoxazole-Trimethoprim]     She does not remember what her reaction is but she knows she is allergic to it.   Ciprofloxacin Other (See Comments)   Penicillins Swelling    Joint swelling   Prednisone     Other reaction(s): Headache, Other (See Comments) Uncontrollable blood sugar - states she did not have this reaction from cortisone injections   Statins Other (See Comments)   Sulfa Antibiotics Other (See Comments)     Family History  Problem Relation Age of Onset   Breast cancer Neg Hx     Prior to Admission medications   Medication Sig Start Date End Date Taking? Authorizing Provider  amLODipine (NORVASC) 10 MG tablet Take 10 mg by mouth daily.  02/12/14   [provider]  Ashwagandha (ASHWAGANDHA 35) 120 MG CAPS Take by mouth.    [provider]  benazepril (LOTENSIN) 40 MG tablet Take 40 mg by mouth daily.  12/04/14   [provider]  empagliflozin (JARDIANCE) 10 MG TABS tablet Take 1 tablet by mouth daily. 05/29/23 05/28/24  [provider]  gabapentin (NEURONTIN) 300 MG capsule Take 300 mg by mouth daily as needed (pain). 07/18/20   [provider]  glimepiride (AMARYL) 4 MG tablet Take 4 mg by mouth daily. Patient not taking: Reported on 06/13/2023 02/12/14   [provider]  HUMALOG KWIKPEN 100 UNIT/ML KwikPen Inject 5 Units into the skin daily as needed (high blood sugar). Sliding scale 08/04/20   [provider]  insulin detemir (LEVEMIR) 100 UNIT/ML FlexPen Inject 14 Units into the skin daily. 02/19/14   [provider]  metFORMIN (GLUCOPHAGE) 1000 MG tablet Take 1,000 mg by mouth daily.  02/12/14   [provider]  Multiple Vitamin (MULTIVITAMIN) capsule Take 1 capsule by mouth daily.    [provider]  Omega-3 Fatty Acids (SUPER TWIN EPA/DHA) 1250 MG CAPS Take 2 capsules by mouth daily. 05/18/09   [provider]  PREMARIN vaginal cream Estrogen Cream Instruction Discard applicator Apply pea sized amount to tip of finger to urethra before bed. Wash hands well after application. Use Monday, Wednesday and Friday 04/13/22   Vanna Scotland, MD  Propylene Glycol-Glycerin (SOOTHE) 0.6-0.6 % SOLN Place 1 drop into both eyes daily as needed (dry eyes). Patient not taking: Reported on 06/13/2023    [provider]    Physical Exam: Vitals:   06/22/23 1630 06/22/23 1631 06/22/23 2005 06/22/23 2018   BP: (!) 143/81  (!) 168/75   Pulse: 81  72   Resp: 19  13   Temp: 97.8 F (36.6 C)   (!) 97.5 F (36.4 C)  TempSrc: Oral   Oral  SpO2: 98%  100%   Weight:  57.2 kg    Height:  5\' 4"  (1.626 m)     Physical Exam  Labs on Admission: I have personally reviewed following labs and imaging studies  CBC: Recent Labs  Lab 06/22/23 1633  WBC 6.3  NEUTROABS 3.7  HGB 15.1*  HCT 45.5  MCV 84.3  PLT 363   Basic Metabolic Panel: Recent Labs  Lab 06/22/23 1633  NA 136  K 3.6  CL 103  CO2 19*  GLUCOSE 99  BUN 17  CREATININE 0.67  CALCIUM 9.6   GFR: Estimated Creatinine Clearance: 52.5 mL/min (by C-G formula based on SCr of 0.67 mg/dL). Liver Function Tests: Recent Labs  Lab 06/22/23 1633  AST 20  ALT 19  ALKPHOS 62  BILITOT 0.6  PROT 8.9*  ALBUMIN 4.7   No results for input(s): "LIPASE", "AMYLASE" in the last 168 hours. No results for input(s): "AMMONIA" in the last 168 hours. Coagulation Profile: Recent Labs  Lab 06/22/23 1633  INR 0.9   Cardiac Enzymes: No results for input(s): "CKTOTAL", "CKMB", "CKMBINDEX", "TROPONINI" in the last 168 hours. BNP (last 3 results) No results for input(s): "PROBNP" in the last 8760 hours. HbA1C: No results for input(s): "HGBA1C" in the last 72 hours. CBG: Recent Labs  Lab 06/22/23 1632  GLUCAP 98   Lipid Profile: No results for input(s): "CHOL", "HDL", "LDLCALC", "TRIG", "CHOLHDL", "LDLDIRECT" in the last 72 hours. Thyroid Function Tests: No results for input(s): "TSH", "T4TOTAL", "FREET4", "T3FREE", "THYROIDAB" in the last 72 hours. Anemia Panel: No results for input(s): "VITAMINB12", "FOLATE", "FERRITIN", "TIBC", "IRON", "RETICCTPCT" in the last 72 hours. Urine analysis:    Component Value Date/Time   COLORURINE YELLOW 04/03/2022 1050   APPEARANCEUR Clear 04/11/2022 1456   LABSPEC <1.005 (L) 04/03/2022 1050   LABSPEC 1.014 10/19/2013 1721   PHURINE 5.5 04/03/2022 1050   GLUCOSEU Negative 04/11/2022 1456    GLUCOSEU 50 mg/dL 98/06/9146 8295   HGBUR NEGATIVE 04/03/2022 1050   BILIRUBINUR Negative 04/11/2022 1456   BILIRUBINUR Negative 10/19/2013 1721   KETONESUR NEGATIVE 04/03/2022 1050   PROTEINUR Negative 04/11/2022 1456   PROTEINUR NEGATIVE 04/03/2022 1050   NITRITE Negative 04/11/2022 1456   NITRITE POSITIVE (A) 04/03/2022 1050   LEUKOCYTESUR Trace (A) 04/11/2022 1456   LEUKOCYTESUR MODERATE (A) 04/03/2022 1050   LEUKOCYTESUR 1+ 10/19/2013 1721    Radiological Exams on Admission: No results found.   Data Reviewed: Relevant notes from primary care and specialist visits, past discharge summaries as available in EHR, including Care Everywhere. Prior diagnostic testing as pertinent to current admission diagnoses Updated medications and problem lists for reconciliation ED course, including vitals, labs, imaging, treatment and response to treatment Triage notes, nursing and pharmacy notes and ED provider's notes Notable results as noted in HPI   Assessment and Plan: * TIA (transient ischemic attack) Permissive hypertension for first 24-48 hrs post stroke onset: Prn Labetalol IV or Vasotec IV If BP greater than 220/120  Statins for LDL goal less than 70 ASA 81mg  daily, Plavix 75mg  daily x 3 weeks then monotherapy thereafter Telemetry, echo, MRI Avoid dextrose containing fluids, Maintain euglycemia, euthermia Neuro checks q4 hrs x 24 hrs and then per shift Head of bed 30 degrees Physical therapy/Occupational therapy/Speech therapy if failed dysphagia screen Neurology consult to follow   Cyst of occipital lobe, stable (HCC) CT showing: "Benign appearing cystic lesion measuring 3.7 cm at the left occipital lobe, relatively stable from prior MR" No acute disease suspected  Abnormality of plasma protein Currently being evaluated by oncology for elevated immunoglobulin free light chain, last seen 10/30 No acute issues  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) No acute  concerns.  Hemoglobin robust at 15.1  Insulin dependent type 2 diabetes mellitus (HCC) Continue basal insulin Sliding scale insulin coverage  Hypertension Hold antihypertensives to allow for permissive hypertension    DVT prophylaxis: Lovenox  Consults: neurology  Advance Care Planning:   Code Status: Prior   Family Communication: none  Disposition Plan: Back to previous home environment  Severity of Illness: The appropriate patient status for this patient is OBSERVATION. Observation status is judged to be reasonable and necessary in order to provide the required intensity of service to ensure the patient's safety. The patient's presenting symptoms, physical exam findings, and initial radiographic and laboratory  data in the context of their medical condition is felt to place them at decreased risk for further clinical deterioration. Furthermore, it is anticipated that the patient will be medically stable for discharge from the hospital within 2 midnights of admission.   Author: Andris Baumann, MD 06/22/2023 8:31 PM  For on call review [email or web address].

## 2023-06-22 NOTE — Assessment & Plan Note (Addendum)
Permissive hypertension for first 24-48 hrs post stroke onset: Prn Labetalol IV or Vasotec IV If BP greater than 220/120  Statins for LDL goal less than 70 ASA 81mg  daily, Plavix 75mg  daily x 3 weeks then monotherapy thereafter Telemetry, echo, MRI Avoid dextrose containing fluids, Maintain euglycemia, euthermia Neuro checks q4 hrs x 24 hrs and then per shift Head of bed 30 degrees Physical therapy/Occupational therapy/Speech therapy if failed dysphagia screen Neurology consult to follow

## 2023-06-22 NOTE — ED Notes (Signed)
ED TO INPATIENT HANDOFF REPORT  ED Nurse Name and Phone #:  Cheral Almas [phone]  S Name/Age/Gender Mia Todd 77 y.o. female Room/Bed: ED05A/ED05A  Code Status   Code Status: Prior  Home/SNF/Other Home Patient oriented to: self, place, time, and situation Is this baseline? Yes   Triage Complete: Triage complete  Chief Complaint TIA (transient ischemic attack) [G45.9]  Triage Note Pt sts that she was driving and she felt the right side of her face go numb. Pt sts that she pulled off and called her PCP. PCP suggested pt come to the ED for futher work up. Pt sts that her s/s resolved themselves while she was on the phone with the PCP.    Allergies Allergies  Allergen Reactions   Doxycycline Shortness Of Breath    Difficulty taking deep breaths   Levaquin [Levofloxacin] Shortness Of Breath   Bactrim [Sulfamethoxazole-Trimethoprim]     She does not remember what her reaction is but she knows she is allergic to it.   Ciprofloxacin Other (See Comments)   Penicillins Swelling    Joint swelling   Prednisone     Other reaction(s): Headache, Other (See Comments) Uncontrollable blood sugar - states she did not have this reaction from cortisone injections   Statins Other (See Comments)   Sulfa Antibiotics Other (See Comments)    Level of Care/Admitting Diagnosis ED Disposition     ED Disposition  Admit   Condition  --   Comment  Hospital Area: [facility] [[id number]]  Level of Care: Telemetry Medical [104]  Covid Evaluation: Asymptomatic - no recent exposure (last 10 days) testing not required  Diagnosis: TIA (transient ischemic attack) [562130]  Admitting Physician: Andris Baumann [[id number]]  Attending Physician: Andris Baumann [[id number]]          B Medical/Surgery History Past Medical History:  Diagnosis Date   Chronic kidney disease    DDD (degenerative disc disease), lumbar    Diabetes mellitus without complication (HCC)     Diverticulitis    GERD (gastroesophageal reflux disease)    Hyperlipidemia    Hypertension    Recurrent UTI    Past Surgical History:  Procedure Laterality Date   BACK SURGERY     BIOPSY  01/07/2021   Procedure: BIOPSY;  Surgeon: Sherrilyn Rist, MD;  Location: [facility] ENDOSCOPY;  Service: Gastroenterology;;   BREAST CYST ASPIRATION Left 1974   neg   COLONOSCOPY WITH PROPOFOL N/A 03/05/2018   Procedure: COLONOSCOPY WITH PROPOFOL;  Surgeon: Christena Deem, MD;  Location: [facility] ENDOSCOPY;  Service: Endoscopy;  Laterality: N/A;   COLONOSCOPY WITH PROPOFOL N/A 01/08/2021   Procedure: COLONOSCOPY WITH PROPOFOL;  Surgeon: Sherrilyn Rist, MD;  Location: [facility] ENDOSCOPY;  Service: Gastroenterology;  Laterality: N/A;   DILATION AND CURETTAGE OF UTERUS     ESOPHAGOGASTRODUODENOSCOPY (EGD) WITH PROPOFOL N/A 01/07/2021   Procedure: ESOPHAGOGASTRODUODENOSCOPY (EGD) WITH PROPOFOL;  Surgeon: Sherrilyn Rist, MD;  Location: [facility] ENDOSCOPY;  Service: Gastroenterology;  Laterality: N/A;   LUMBAR FUSION     REMOVE OVARIAN CYST     SPINAL FUSION     TUBAL LIGATION       A IV Location/Drains/Wounds Patient Lines/Drains/Airways Status     Active Line/Drains/Airways     None            Intake/Output Last 24 hours No intake or output data in the 24 hours ending 06/22/23 2017  Labs/Imaging Results for orders placed or performed during the hospital encounter  of 06/22/23 (from the past 48 hour(s))  CBG monitoring, ED     Status: None   Collection Time: 06/22/23  4:32 PM  Result Value Ref Range   Glucose-Capillary 98 70 - 99 mg/dL    Comment: Glucose reference range applies only to samples taken after fasting for at least 8 hours.  Protime-INR     Status: None   Collection Time: 06/22/23  4:33 PM  Result Value Ref Range   Prothrombin Time 12.4 11.4 - 15.2 seconds   INR 0.9 0.8 - 1.2    Comment: (NOTE) INR goal varies based on device and disease states. Performed at [facility],  [address] [place], [place] [address]   APTT     Status: None   Collection Time: 06/22/23  4:33 PM  Result Value Ref Range   aPTT 24 24 - 36 seconds    Comment: Performed at [facility], [address] [place], [place] [address]  CBC     Status: Abnormal   Collection Time: 06/22/23  4:33 PM  Result Value Ref Range   WBC 6.3 4.0 - 10.5 K/uL   RBC 5.40 (H) 3.87 - 5.11 MIL/uL   Hemoglobin 15.1 (H) 12.0 - 15.0 g/dL   HCT 28.4 13.2 - 44.0 %   MCV 84.3 80.0 - 100.0 fL   MCH 28.0 26.0 - 34.0 pg   MCHC 33.2 30.0 - 36.0 g/dL   RDW 10.2 72.5 - 36.6 %   Platelets 363 150 - 400 K/uL   nRBC 0.0 0.0 - 0.2 %    Comment: Performed at [facility], [address] [place], [place] [address]  Differential     Status: None   Collection Time: 06/22/23  4:33 PM  Result Value Ref Range   Neutrophils Relative % 59 %   Neutro Abs 3.7 1.7 - 7.7 K/uL   Lymphocytes Relative 30 %   Lymphs Abs 1.9 0.7 - 4.0 K/uL   Monocytes Relative 9 %   Monocytes Absolute 0.5 0.1 - 1.0 K/uL   Eosinophils Relative 2 %   Eosinophils Absolute 0.1 0.0 - 0.5 K/uL   Basophils Relative 0 %   Basophils Absolute 0.0 0.0 - 0.1 K/uL   Immature Granulocytes 0 %   Abs Immature Granulocytes 0.02 0.00 - 0.07 K/uL    Comment: Performed at [facility], [address] [place], [place] [address]  Comprehensive metabolic panel     Status: Abnormal   Collection Time: 06/22/23  4:33 PM  Result Value Ref Range   Sodium 136 135 - 145 mmol/L   Potassium 3.6 3.5 - 5.1 mmol/L   Chloride 103 98 - 111 mmol/L   CO2 19 (L) 22 - 32 mmol/L   Glucose, Bld 99 70 - 99 mg/dL    Comment: Glucose reference range applies only to samples taken after fasting for at least 8 hours.   BUN 17 8 - 23 mg/dL   Creatinine, Ser 5.63 0.44 - 1.00 mg/dL   Calcium 9.6 8.9 - 87.5 mg/dL   Total Protein 8.9 (H) 6.5 - 8.1 g/dL   Albumin 4.7 3.5 - 5.0 g/dL   AST 20 15 - 41 U/L   ALT 19 0 - 44 U/L   Alkaline Phosphatase 62 38  - 126 U/L   Total Bilirubin 0.6 <1.2 mg/dL   GFR, Estimated >64 >33 mL/min    Comment: (NOTE) Calculated using the CKD-EPI Creatinine Equation (2021)    Anion gap 14  5 - 15    Comment: Performed at [facility], [address] [place], [place] [address]  Ethanol     Status: None   Collection Time: 06/22/23  4:33 PM  Result Value Ref Range   Alcohol, Ethyl (B) <10 <10 mg/dL    Comment: (NOTE) Lowest detectable limit for serum alcohol is 10 mg/dL.  For medical purposes only. Performed at [facility], [address] [place], [place] [address]    No results found.  Pending Labs Unresulted Labs (From admission, onward)    None       Vitals/Pain Today's Vitals   06/22/23 1630 06/22/23 1631 06/22/23 2005  BP: (!) 143/81  (!) 168/75  Pulse: 81  72  Resp: 19  13  Temp: 97.8 F (36.6 C)    TempSrc: Oral    SpO2: 98%  100%  Weight:  57.2 kg   Height:  5\' 4"  (1.626 m)   PainSc:  0-No pain     Isolation Precautions No active isolations  Medications Medications - No data to display  Mobility walks     Focused Assessments Neuro Assessment Handoff:  Swallow screen pass? Yes          Neuro Assessment:   Neuro Checks:      Has TPA been given? No If patient is a Neuro Trauma and patient is going to OR before floor call report to 4N Charge nurse: [phone] or [phone]   R Recommendations: See Admitting Provider Note  Report given to:   Additional Notes:

## 2023-06-22 NOTE — Assessment & Plan Note (Signed)
No acute concerns.  Hemoglobin robust at 15.1

## 2023-06-22 NOTE — Assessment & Plan Note (Signed)
Currently being evaluated by oncology for elevated immunoglobulin free light chain, last seen 10/30 No acute issues

## 2023-06-22 NOTE — Assessment & Plan Note (Signed)
Hold antihypertensives to allow for permissive hypertension 

## 2023-06-22 NOTE — ED Triage Notes (Signed)
Pt sts that she was driving and she felt the right side of her face go numb. Pt sts that she pulled off and called her PCP. PCP suggested pt come to the ED for futher work up. Pt sts that her s/s resolved themselves while she was on the phone with the PCP.

## 2023-06-22 NOTE — Assessment & Plan Note (Signed)
CT showing: "Benign appearing cystic lesion measuring 3.7 cm at the left occipital lobe, relatively stable from prior MR" No acute disease suspected

## 2023-06-22 NOTE — Assessment & Plan Note (Signed)
Continue basal insulin.  Sliding scale insulin coverage

## 2023-06-22 NOTE — ED Provider Notes (Addendum)
[facility] Provider Note    Event Date/Time   First MD Initiated Contact with Patient 06/22/23 1935     (approximate)   History   Facial Droop   HPI  Mia Todd is a 77 y.o. female with hypertension, diabetes who comes in with concerns for facial droop.  Patient reports that she was in the car when she developed right-sided facial numbness, tingling and looked in the mirror noted she had a right-sided facial droop and difficulty speaking as well as right arm numbness and weakness.  She denies any obvious weakness in her legs.  She states that she now feels at her baseline self and this lasted for a total of about 3 minutes.  She denies ever having anything like this happen before.  She denies any chest pain, shortness of breath.     Physical Exam   Triage Vital Signs: ED Triage Vitals  Encounter Vitals Group     BP 06/22/23 1630 (!) 143/81     Systolic BP Percentile --      Diastolic BP Percentile --      Pulse Rate 06/22/23 1630 81     Resp 06/22/23 1630 19     Temp 06/22/23 1630 97.8 F (36.6 C)     Temp Source 06/22/23 1630 Oral     SpO2 06/22/23 1630 98 %     Weight 06/22/23 1631 126 lb (57.2 kg)     Height 06/22/23 1631 5\' 4"  (1.626 m)     Head Circumference --      Peak Flow --      Pain Score 06/22/23 1631 0     Pain Loc --      Pain Education --      Exclude from Growth Chart --     Most recent vital signs: Vitals:   06/22/23 1630  BP: (!) 143/81  Pulse: 81  Resp: 19  Temp: 97.8 F (36.6 C)  SpO2: 98%     General: Awake, no distress.  CV:  Good peripheral perfusion.  Resp:  Normal effort.  Abd:  No distention.  Other:  NIHSS is 0.   ED Results / Procedures / Treatments   Labs (all labs ordered are listed, but only abnormal results are displayed) Labs Reviewed  CBC - Abnormal; Notable for the following components:      Result Value   RBC 5.40 (*)    Hemoglobin 15.1 (*)    All other components within normal  limits  COMPREHENSIVE METABOLIC PANEL - Abnormal; Notable for the following components:   CO2 19 (*)    Total Protein 8.9 (*)    All other components within normal limits  PROTIME-INR  APTT  DIFFERENTIAL  ETHANOL  CBG MONITORING, ED  CBG MONITORING, ED  I-STAT CREATININE, ED     RADIOLOGY I have reviewed the CT head personally and interpreted and cystic mass-  similar to prior CT   PROCEDURES:  Critical Care performed: No  .1-3 Lead EKG Interpretation  Performed by: Concha Se, MD Authorized by: Concha Se, MD     Interpretation: abnormal     ECG rate:  80   ECG rate assessment: normal     Rhythm: sinus rhythm     Ectopy: none     Conduction: normal     EKG interpretation is sinus rate of 73 without any ST elevation or T wave inversions, normal intervals  MEDICATIONS ORDERED IN ED: Medications - No data to  display   IMPRESSION / MDM / ASSESSMENT AND PLAN / ED COURSE  I reviewed the triage vital signs and the nursing notes.   Patient's presentation is most consistent with acute presentation with potential threat to life or bodily function.   Differential TIA stroke, hemorrhage, electrolyte abnormalities  Patient comes in with concerns for right-sided facial droop.  This seems concerning for TIA given her risk factors of her age diabetes and hypertension I recommended admission for TIA workup.  CT head ordered without evidence of intracranial hemorrhage based upon my read.  Alcohol level was negative.  CBC reassuring.  CMP slightly low bicarb glucose was normal.    The patient is on the cardiac monitor to evaluate for evidence of arrhythmia and/or significant heart rate changes.      FINAL CLINICAL IMPRESSION(S) / ED DIAGNOSES   Final diagnoses:  TIA (transient ischemic attack)     Rx / DC Orders   ED Discharge Orders     None        Note:  This document was prepared using Dragon voice recognition software and may include unintentional  dictation errors.   Concha Se, MD 06/22/23 1946    Concha Se, MD 06/22/23 2005

## 2023-06-23 ENCOUNTER — Encounter: Payer: Self-pay | Admitting: Internal Medicine

## 2023-06-23 ENCOUNTER — Observation Stay: Payer: Medicare Other

## 2023-06-23 ENCOUNTER — Observation Stay (HOSPITAL_BASED_OUTPATIENT_CLINIC_OR_DEPARTMENT_OTHER)
Admit: 2023-06-23 | Discharge: 2023-06-23 | Disposition: A | Discharge status: Home / Self Care | Payer: Medicare Other | Attending: Internal Medicine | Admitting: Internal Medicine

## 2023-06-23 DIAGNOSIS — G459 Transient cerebral ischemic attack, unspecified: Secondary | ICD-10-CM

## 2023-06-23 LAB — LIPID PANEL
Cholesterol: 248 mg/dL — ABNORMAL HIGH (ref 0–200)
HDL: 49 mg/dL (ref >40)
LDL Cholesterol: 167 mg/dL — ABNORMAL HIGH (ref 0–99)
Total CHOL/HDL Ratio: 5.1 {ratio}
Triglycerides: 159 mg/dL — ABNORMAL HIGH (ref <150)
VLDL: 32 mg/dL (ref 0–40)

## 2023-06-23 LAB — HEMOGLOBIN A1C
Hgb A1c MFr Bld: 8.2 % — ABNORMAL HIGH (ref 4.8–5.6)
Mean Plasma Glucose: 188.64 mg/dL

## 2023-06-23 LAB — GLUCOSE, CAPILLARY
Glucose-Capillary: 163 mg/dL — ABNORMAL HIGH (ref 70–99)
Glucose-Capillary: 215 mg/dL — ABNORMAL HIGH (ref 70–99)
Glucose-Capillary: 216 mg/dL — ABNORMAL HIGH (ref 70–99)

## 2023-06-23 LAB — ECHOCARDIOGRAM COMPLETE BUBBLE STUDY
AR max vel: 1.64 cm2
AV Peak grad: 8.5 mm[Hg]
Ao pk vel: 1.46 m/s
Area-P 1/2: 2.1 cm2
S' Lateral: 2.9 cm

## 2023-06-23 MED ORDER — CLOPIDOGREL BISULFATE 75 MG PO TABS
75.0000 mg | ORAL_TABLET | Freq: Every day | ORAL | Status: DC
  Administered 2023-06-23: 75 mg via ORAL
  Filled 2023-06-23: qty 1

## 2023-06-23 MED ORDER — EZETIMIBE 10 MG PO TABS: 10.0000 mg | ORAL_TABLET | Freq: Every day | ORAL | 0 refills | Status: AC

## 2023-06-23 MED ORDER — ASPIRIN 81 MG PO TBEC
81.0000 mg | DELAYED_RELEASE_TABLET | Freq: Every day | ORAL | Status: DC
  Administered 2023-06-23: 81 mg via ORAL
  Filled 2023-06-23: qty 1

## 2023-06-23 MED ORDER — EZETIMIBE 10 MG PO TABS
10.0000 mg | ORAL_TABLET | Freq: Every day | ORAL | Status: DC
  Administered 2023-06-23: 10 mg via ORAL
  Filled 2023-06-23: qty 1

## 2023-06-23 MED ORDER — ASPIRIN 81 MG PO TBEC: 81.0000 mg | DELAYED_RELEASE_TABLET | Freq: Every day | ORAL | 3 refills | Status: AC

## 2023-06-23 MED ORDER — CLOPIDOGREL BISULFATE 75 MG PO TABS: 75.0000 mg | ORAL_TABLET | Freq: Every day | ORAL | 0 refills | Status: AC

## 2023-06-23 NOTE — Progress Notes (Signed)
Pt received from procedure via wheel chair in stable condition.

## 2023-06-23 NOTE — Progress Notes (Signed)
Patient A&Ox4. No complaints of SOB or pain prior to discharge. Patient in no acute distress. AVS reviewed with patient. Patient verbalized understanding of AVS. IV removed prior to discharge. Patient discharging home & stated that she will drive herself home in her own personal vehicle that is parked at the hospital.

## 2023-06-23 NOTE — Progress Notes (Signed)
  Echocardiogram 2D Echocardiogram has been performed. A Saline Microcavitation (Bubble Study) was requested and performed on this study.  Mia Todd 06/23/2023, 2:17 PM

## 2023-06-23 NOTE — Evaluation (Signed)
Occupational Therapy Evaluation Patient Details Name: Mia Todd MRN: [id number] DOB: May 16, 1946 Today's Date: 06/23/2023   History of Present Illness Mia Todd is a 77 y.o. female with medical history significant for Insulin-dependent type 2 diabetes, HTN, upper GI bleed from gastropathy in 2022 (negative biopsy), known occipital lobe cyst, recent elevated immunoglobulin free light chain, currently undergoing workup by oncology, who presents to the ED with strokelike symptoms.  Patient was driving driving when she felt the right side of her face go numb and noticed that she had a right facial droop and she also felt like her speech was slurred.  She still noted numbness in her right arm.  Symptoms lasted 3 minutes before resolving.  Patient called her PCP and she was instructed to present to the ED. she was asymptomatic upon arrival. 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 She was diagnosed with TIA.   Clinical Impression   Mia Todd seen for OT evaluation this date. Prior to hospital admission, pt was independent in all aspects of ADL/IADL, and denies falls history in past 12 months. Pt lives with alone in a one level home with 4 steps to enter. Currently pt reporting symptoms have resolved. Pt demonstrates baseline independence to perform ADL and mobility tasks and no strength, sensory, coordination, cognitive, or visual deficits appreciated with assessment. No skilled OT needs identified. Will sign off. Please re-consult if additional OT needs arise during this hospital stay.         If plan is discharge home, recommend the following:      Functional Status Assessment  Patient has not had a recent decline in their functional status  Equipment Recommendations  None recommended by OT    Recommendations for Other Services       Precautions / Restrictions Precautions Precautions: None Restrictions Weight Bearing Restrictions: No       Mobility Bed Mobility Overal bed mobility: Modified Independent             General bed mobility comments: not observed; patient out of bed upon entry of OT    Transfers Overall transfer level: Independent                        Balance Overall balance assessment: Modified Independent                                         ADL either performed or assessed with clinical judgement   ADL Overall ADL's : At baseline                                       General ADL Comments: Pt presents at functional baseline level of independence with no strength, sensory, visual, or cognitive deficits appreciated. She endorses baseline LE pain, but does not anticipate any functional deficits upon DC home. Has been up to the bathroom and moving without assistance.     Vision Patient Visual Report: No change from baseline       Perception         Praxis         Pertinent Vitals/Pain Pain Assessment Pain Assessment: No/denies pain     Extremity/Trunk Assessment Upper Extremity Assessment Upper Extremity Assessment: Overall WFL for tasks assessed (BUE grossly normal 5/5  t/o with no focal deficits appreciated.)   Lower Extremity Assessment Lower Extremity Assessment: Overall WFL for tasks assessed   Cervical / Trunk Assessment Cervical / Trunk Assessment: Normal   Communication Communication Communication: No apparent difficulties Cueing Techniques: Verbal cues   Cognition Arousal: Alert Behavior During Therapy: WFL for tasks assessed/performed Overall Cognitive Status: Within Functional Limits for tasks assessed                                 General Comments: Pleasant, conversational, A&O x4     General Comments  no skin concerns identified;    Exercises Other Exercises Other Exercises: Pt educated on role of OT in acute setting as well as OP OT as potential follow up should functional deficits arise  upon hospital DC. Pt return verbalizes understanding and declines functional concerns or deficits at this time.   Shoulder Instructions      Home Living Family/patient expects to be discharged to:: Private residence Living Arrangements: Alone Available Help at Discharge: Family;Available PRN/intermittently Type of Home: House Home Access: Stairs to enter [employer] of Steps: 4 Entrance Stairs-Rails: Right;Left;Can reach both Home Layout: One level     Bathroom Shower/Tub: Chief Strategy Officer: Standard Bathroom Accessibility: Yes   Home Equipment: Agricultural consultant (2 wheels);Cane - single point   Additional Comments: would use SPC occasionally      Prior Functioning/Environment Prior Level of Function : Independent/Modified Independent;Driving             Mobility Comments: Mod I for all ADLs. would use a cane intermittently as needed. no recent falls ADLs Comments: Mod I        OT Problem List: Decreased coordination;Impaired sensation      OT Treatment/Interventions:      OT Goals(Current goals can be found in the care plan section) Acute Rehab OT Goals Patient Stated Goal: To go home OT Goal Formulation: All assessment and education complete, DC therapy Time For Goal Achievement: 06/23/23 Potential to Achieve Goals: Good  OT Frequency:      Co-evaluation              AM-PAC OT "6 Clicks" Daily Activity     Outcome Measure Help from another person eating meals?: None Help from another person taking care of personal grooming?: None Help from another person toileting, which includes using toliet, bedpan, or urinal?: None Help from another person bathing (including washing, rinsing, drying)?: None Help from another person to put on and taking off regular upper body clothing?: None Help from another person to put on and taking off regular lower body clothing?: None 6 Click Score: 24   End of Session    Activity Tolerance:  Patient tolerated treatment well Patient left: in chair;with call bell/phone within reach  OT Visit Diagnosis: Other symptoms and signs involving the nervous system (Q25.956)                Time: 3875-6433 OT Time Calculation (min): 18 min Charges:  OT General Charges $OT Visit: 1 Visit OT Evaluation $OT Eval Low Complexity: 1 Low  Rockney Ghee, M.S., OTR/L 06/23/23, 10:59 AM

## 2023-06-23 NOTE — Plan of Care (Signed)
Problem: Education: Goal: Knowledge of General Education information will improve Description: Including pain rating scale, medication(s)/side effects and non-pharmacologic comfort measures 06/23/2023 1757 by Leandro Reasoner, RN Outcome: Progressing 06/23/2023 1755 by Leandro Reasoner, RN Outcome: Progressing   Problem: Health Behavior/Discharge Planning: Goal: Ability to manage health-related needs will improve 06/23/2023 1757 by Leandro Reasoner, RN Outcome: Progressing 06/23/2023 1755 by Leandro Reasoner, RN Outcome: Progressing   Problem: Clinical Measurements: Goal: Ability to maintain clinical measurements within normal limits will improve 06/23/2023 1757 by Leandro Reasoner, RN Outcome: Progressing 06/23/2023 1755 by Leandro Reasoner, RN Outcome: Progressing Goal: Will remain free from infection 06/23/2023 1757 by Leandro Reasoner, RN Outcome: Progressing 06/23/2023 1755 by Leandro Reasoner, RN Outcome: Progressing Goal: Diagnostic test results will improve 06/23/2023 1757 by Leandro Reasoner, RN Outcome: Progressing 06/23/2023 1755 by Leandro Reasoner, RN Outcome: Progressing Goal: Respiratory complications will improve 06/23/2023 1757 by Leandro Reasoner, RN Outcome: Progressing 06/23/2023 1755 by Leandro Reasoner, RN Outcome: Progressing Goal: Cardiovascular complication will be avoided 06/23/2023 1757 by Leandro Reasoner, RN Outcome: Progressing 06/23/2023 1755 by Leandro Reasoner, RN Outcome: Progressing   Problem: Activity: Goal: Risk for activity intolerance will decrease 06/23/2023 1757 by Leandro Reasoner, RN Outcome: Progressing 06/23/2023 1755 by Leandro Reasoner, RN Outcome: Progressing   Problem: Nutrition: Goal: Adequate nutrition will be maintained 06/23/2023 1757 by Leandro Reasoner, RN Outcome: Progressing 06/23/2023 1755 by Leandro Reasoner, RN Outcome: Progressing   Problem: Coping: Goal: Level of anxiety will decrease 06/23/2023 1757 by Leandro Reasoner, RN Outcome:  Progressing 06/23/2023 1755 by Leandro Reasoner, RN Outcome: Progressing   Problem: Elimination: Goal: Will not experience complications related to bowel motility 06/23/2023 1757 by Leandro Reasoner, RN Outcome: Progressing 06/23/2023 1755 by Leandro Reasoner, RN Outcome: Progressing Goal: Will not experience complications related to urinary retention 06/23/2023 1757 by Leandro Reasoner, RN Outcome: Progressing 06/23/2023 1755 by Leandro Reasoner, RN Outcome: Progressing   Problem: Pain Management: Goal: General experience of comfort will improve 06/23/2023 1757 by Leandro Reasoner, RN Outcome: Progressing 06/23/2023 1755 by Leandro Reasoner, RN Outcome: Progressing   Problem: Safety: Goal: Ability to remain free from injury will improve 06/23/2023 1757 by Leandro Reasoner, RN Outcome: Progressing 06/23/2023 1755 by Leandro Reasoner, RN Outcome: Progressing   Problem: Skin Integrity: Goal: Risk for impaired skin integrity will decrease 06/23/2023 1757 by Leandro Reasoner, RN Outcome: Progressing 06/23/2023 1755 by Leandro Reasoner, RN Outcome: Progressing   Problem: Education: Goal: Ability to describe self-care measures that may prevent or decrease complications (Diabetes Survival Skills Education) will improve 06/23/2023 1757 by Leandro Reasoner, RN Outcome: Progressing 06/23/2023 1755 by Leandro Reasoner, RN Outcome: Progressing Goal: Individualized Educational Video(s) 06/23/2023 1757 by Leandro Reasoner, RN Outcome: Progressing 06/23/2023 1755 by Leandro Reasoner, RN Outcome: Progressing   Problem: Coping: Goal: Ability to adjust to condition or change in health will improve 06/23/2023 1757 by Leandro Reasoner, RN Outcome: Progressing 06/23/2023 1755 by Leandro Reasoner, RN Outcome: Progressing   Problem: Fluid Volume: Goal: Ability to maintain a balanced intake and output will improve 06/23/2023 1757 by Leandro Reasoner, RN Outcome: Progressing 06/23/2023 1755 by Leandro Reasoner,  RN Outcome: Progressing   Problem: Health Behavior/Discharge Planning: Goal: Ability to identify and utilize available resources and services will improve 06/23/2023 1757 by Leandro Reasoner, RN Outcome: Progressing 06/23/2023 1755 by Leandro Reasoner, RN Outcome: Progressing Goal: Ability to manage health-related needs will improve 06/23/2023 1757 by Leandro Reasoner, RN Outcome: Progressing 06/23/2023 1755 by Leandro Reasoner, RN Outcome: Progressing   Problem: Metabolic: Goal: Ability to maintain appropriate glucose levels will improve  06/23/2023 1757 by Leandro Reasoner, RN Outcome: Progressing 06/23/2023 1755 by Leandro Reasoner, RN Outcome: Progressing   Problem: Nutritional: Goal: Maintenance of adequate nutrition will improve 06/23/2023 1757 by Leandro Reasoner, RN Outcome: Progressing 06/23/2023 1755 by Leandro Reasoner, RN Outcome: Progressing Goal: Progress toward achieving an optimal weight will improve 06/23/2023 1757 by Leandro Reasoner, RN Outcome: Progressing 06/23/2023 1755 by Leandro Reasoner, RN Outcome: Progressing   Problem: Skin Integrity: Goal: Risk for impaired skin integrity will decrease 06/23/2023 1757 by Leandro Reasoner, RN Outcome: Progressing 06/23/2023 1755 by Leandro Reasoner, RN Outcome: Progressing   Problem: Tissue Perfusion: Goal: Adequacy of tissue perfusion will improve 06/23/2023 1757 by Leandro Reasoner, RN Outcome: Progressing 06/23/2023 1755 by Leandro Reasoner, RN Outcome: Progressing   Problem: Education: Goal: Knowledge of disease or condition will improve 06/23/2023 1757 by Leandro Reasoner, RN Outcome: Progressing 06/23/2023 1755 by Leandro Reasoner, RN Outcome: Progressing Goal: Knowledge of secondary prevention will improve (MUST DOCUMENT ALL) 06/23/2023 1757 by Leandro Reasoner, RN Outcome: Progressing 06/23/2023 1755 by Leandro Reasoner, RN Outcome: Progressing Goal: Knowledge of patient specific risk factors will improve Loraine Leriche N/A or DELETE  if not current risk factor) 06/23/2023 1757 by Leandro Reasoner, RN Outcome: Progressing 06/23/2023 1755 by Leandro Reasoner, RN Outcome: Progressing   Problem: Ischemic Stroke/TIA Tissue Perfusion: Goal: Complications of ischemic stroke/TIA will be minimized 06/23/2023 1757 by Leandro Reasoner, RN Outcome: Progressing 06/23/2023 1755 by Leandro Reasoner, RN Outcome: Progressing   Problem: Coping: Goal: Will verbalize positive feelings about self 06/23/2023 1757 by Leandro Reasoner, RN Outcome: Progressing 06/23/2023 1755 by Leandro Reasoner, RN Outcome: Progressing Goal: Will identify appropriate support needs 06/23/2023 1757 by Leandro Reasoner, RN Outcome: Progressing 06/23/2023 1755 by Leandro Reasoner, RN Outcome: Progressing   Problem: Health Behavior/Discharge Planning: Goal: Ability to manage health-related needs will improve 06/23/2023 1757 by Leandro Reasoner, RN Outcome: Progressing 06/23/2023 1755 by Leandro Reasoner, RN Outcome: Progressing Goal: Goals will be collaboratively established with patient/family 06/23/2023 1757 by Leandro Reasoner, RN Outcome: Progressing 06/23/2023 1755 by Leandro Reasoner, RN Outcome: Progressing   Problem: Self-Care: Goal: Ability to participate in self-care as condition permits will improve 06/23/2023 1757 by Leandro Reasoner, RN Outcome: Progressing 06/23/2023 1755 by Leandro Reasoner, RN Outcome: Progressing Goal: Verbalization of feelings and concerns over difficulty with self-care will improve 06/23/2023 1757 by Leandro Reasoner, RN Outcome: Progressing 06/23/2023 1755 by Leandro Reasoner, RN Outcome: Progressing Goal: Ability to communicate needs accurately will improve 06/23/2023 1757 by Leandro Reasoner, RN Outcome: Progressing 06/23/2023 1755 by Leandro Reasoner, RN Outcome: Progressing   Problem: Nutrition: Goal: Risk of aspiration will decrease 06/23/2023 1757 by Leandro Reasoner, RN Outcome: Progressing 06/23/2023 1755 by Leandro Reasoner,  RN Outcome: Progressing Goal: Dietary intake will improve 06/23/2023 1757 by Leandro Reasoner, RN Outcome: Progressing 06/23/2023 1755 by Leandro Reasoner, RN Outcome: Progressing

## 2023-06-23 NOTE — Evaluation (Signed)
Physical Therapy Evaluation Patient Details Name: Mia Todd MRN: [id number] DOB: 03-27-1946 Today's Date: 06/23/2023  History of Present Illness  Mia Todd is a 77 y.o. female with medical history significant for Insulin-dependent type 2 diabetes, HTN, upper GI bleed from gastropathy in 2022 (negative biopsy), known occipital lobe cyst, recent elevated immunoglobulin free light chain, currently undergoing workup by oncology, who presents to the ED with strokelike symptoms.  Patient was driving driving when she felt the right side of her face go numb and noticed that she had a right facial droop and she also felt like her speech was slurred.  She still noted numbness in her right arm.  Symptoms lasted 3 minutes before resolving.  Patient called her PCP and she was instructed to present to the ED. she was asymptomatic upon arrival. 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 She was diagnosed with TIA.  Clinical Impression  77  yo Female with recent onset of RUE/facial numbness with facial droop, reports improvement in symptoms. Patient was up and walking in room to bathroom upon PT arrival. She ambulates with reciprocal gait pattern, normal base of support, good foot clearance, slightly slower gait speed but no instability noted. She denies any recent falls. Patient reports some numbness in RLE which was present prior to recent symptoms from previous nerve damage, but otherwise no new changes. She demonstrates functional strength in bilateral lower extremities. Patient able to transfer sit to stand without using upper extremities independently without instability. She demonstrates good static/dynamic standing balance. She was able to stand with feet together, eyes closed, no instability. Patient is modified independent in bed mobility, transfers and gait. She lives at home alone. Pt expressed no concerns about returning home. No skilled PT needs identified.          If plan is discharge home, recommend the following:     Can travel by private vehicle        Equipment Recommendations None recommended by PT  Recommendations for Other Services       Functional Status Assessment Patient has had a recent decline in their functional status and demonstrates the ability to make significant improvements in function in a reasonable and predictable amount of time.     Precautions / Restrictions Precautions Precautions: None Restrictions Weight Bearing Restrictions: No      Mobility  Bed Mobility Overal bed mobility: Modified Independent             General bed mobility comments: not observed; patient out of bed upon entry of PT Patient Response: Cooperative  Transfers Overall transfer level: Independent                 General transfer comment: able to transfer sit to stand without using upper extremity and no loss of balance    Ambulation/Gait Ambulation/Gait assistance: Modified independent (Device/Increase time) Gait Distance (Feet): 25 Feet Assistive device: None Gait Pattern/deviations: Step-through pattern Gait velocity: slightly decreased     General Gait Details: pt ambulates in room, no assistive device, reciprocal gait, good foot clearance, normal base of support without instability. Does ambulate at slightly slower pace however could be due to not wearing shoes and in different environment (being careful).  Stairs            Wheelchair Mobility     Tilt Bed Tilt Bed Patient Response: Cooperative  Modified Rankin (Stroke Patients Only)       Balance Overall balance assessment: Modified Independent (static/dynamic standing  balance is good)                                           Pertinent Vitals/Pain Pain Assessment Pain Assessment: No/denies pain    Home Living Family/patient expects to be discharged to:: Private residence Living Arrangements: Alone   Type of  Home: House Home Access: Stairs to enter Entrance Stairs-Rails: Right;Left;Can reach both Entrance Stairs-Number of Steps: 4   Home Layout: One level Home Equipment: Agricultural consultant (2 wheels);Cane - single point Additional Comments: would use SPC occasionally    Prior Function Prior Level of Function : Independent/Modified Independent;Driving             Mobility Comments: Mod I for all ADLs. would use a cane intermittently as needed. no recent falls ADLs Comments: Mod I     Extremity/Trunk Assessment   Upper Extremity Assessment Upper Extremity Assessment: Overall WFL for tasks assessed    Lower Extremity Assessment Lower Extremity Assessment: Overall WFL for tasks assessed (BLE grossly 4/5)    Cervical / Trunk Assessment Cervical / Trunk Assessment: Normal  Communication   Communication Communication: No apparent difficulties Cueing Techniques: Verbal cues  Cognition Arousal: Alert Behavior During Therapy: WFL for tasks assessed/performed Overall Cognitive Status: Within Functional Limits for tasks assessed                                          General Comments General comments (skin integrity, edema, etc.): no skin concerns identified;    Exercises     Assessment/Plan    PT Assessment Patient does not need any further PT services  PT Problem List         PT Treatment Interventions      PT Goals (Current goals can be found in the Care Plan section)  Acute Rehab PT Goals Patient Stated Goal: to go home PT Goal Formulation: With patient Time For Goal Achievement: 06/23/23 Potential to Achieve Goals: Good    Frequency       Co-evaluation               AM-PAC PT "6 Clicks" Mobility  Outcome Measure Help needed turning from your back to your side while in a flat bed without using bedrails?: None Help needed moving from lying on your back to sitting on the side of a flat bed without using bedrails?: None Help needed  moving to and from a bed to a chair (including a wheelchair)?: None Help needed standing up from a chair using your arms (e.g., wheelchair or bedside chair)?: None Help needed to walk in hospital room?: None Help needed climbing 3-5 steps with a railing? : None 6 Click Score: 24    End of Session   Activity Tolerance: Patient tolerated treatment well;No increased pain Patient left: in chair;with call bell/phone within reach Nurse Communication: Mobility status PT Visit Diagnosis: Muscle weakness (generalized) (M62.81)    Time: 9563-8756 PT Time Calculation (min) (ACUTE ONLY): 10 min   Charges:   PT Evaluation $PT Eval Low Complexity: 1 Low   PT General Charges $$ ACUTE PT VISIT: 1 Visit          Robbin Escher PT, DPT 06/23/2023, 8:27 AM

## 2023-06-23 NOTE — Progress Notes (Signed)
SLP Cancellation Note  Patient Details Name: Mia Todd MRN: [id number] DOB: 08/17/1945   Cancelled treatment:       Reason Eval/Treat Not Completed: SLP screened, no needs identified, will sign off (Chart review completed. Noted pt with TIA. Per discussion with OT and PT, no speech/language/cognitive or swallowing needs and pt feels she is at baseline.)  Clyde Canterbury, M.S., CCC-SLP Speech-Language Pathologist [facility] [phone] Arnette Felts)  Woodroe Chen 06/23/2023, 9:29 AM

## 2023-06-23 NOTE — Plan of Care (Signed)
Patient is alert and oriented X 4. No any pain or any complaint. Problem: Education: Goal: Knowledge of General Education information will improve Description: Including pain rating scale, medication(s)/side effects and non-pharmacologic comfort measures Outcome: Progressing   Problem: Health Behavior/Discharge Planning: Goal: Ability to manage health-related needs will improve Outcome: Progressing   Problem: Clinical Measurements: Goal: Ability to maintain clinical measurements within normal limits will improve Outcome: Progressing Goal: Will remain free from infection Outcome: Progressing Goal: Diagnostic test results will improve Outcome: Progressing Goal: Respiratory complications will improve Outcome: Progressing Goal: Cardiovascular complication will be avoided Outcome: Progressing   Problem: Activity: Goal: Risk for activity intolerance will decrease Outcome: Progressing   Problem: Nutrition: Goal: Adequate nutrition will be maintained Outcome: Progressing   Problem: Coping: Goal: Level of anxiety will decrease Outcome: Progressing   Problem: Elimination: Goal: Will not experience complications related to bowel motility Outcome: Progressing Goal: Will not experience complications related to urinary retention Outcome: Progressing   Problem: Pain Management: Goal: General experience of comfort will improve Outcome: Progressing   Problem: Safety: Goal: Ability to remain free from injury will improve Outcome: Progressing   Problem: Skin Integrity: Goal: Risk for impaired skin integrity will decrease Outcome: Progressing   Problem: Education: Goal: Ability to describe self-care measures that may prevent or decrease complications (Diabetes Survival Skills Education) will improve Outcome: Progressing Goal: Individualized Educational Video(s) Outcome: Progressing   Problem: Coping: Goal: Ability to adjust to condition or change in health will  improve Outcome: Progressing   Problem: Fluid Volume: Goal: Ability to maintain a balanced intake and output will improve Outcome: Progressing   Problem: Health Behavior/Discharge Planning: Goal: Ability to identify and utilize available resources and services will improve Outcome: Progressing Goal: Ability to manage health-related needs will improve Outcome: Progressing   Problem: Metabolic: Goal: Ability to maintain appropriate glucose levels will improve Outcome: Progressing   Problem: Nutritional: Goal: Maintenance of adequate nutrition will improve Outcome: Progressing Goal: Progress toward achieving an optimal weight will improve Outcome: Progressing   Problem: Skin Integrity: Goal: Risk for impaired skin integrity will decrease Outcome: Progressing   Problem: Tissue Perfusion: Goal: Adequacy of tissue perfusion will improve Outcome: Progressing   Problem: Education: Goal: Knowledge of disease or condition will improve Outcome: Progressing Goal: Knowledge of secondary prevention will improve (MUST DOCUMENT ALL) Outcome: Progressing Goal: Knowledge of patient specific risk factors will improve Loraine Leriche N/A or DELETE if not current risk factor) Outcome: Progressing   Problem: Ischemic Stroke/TIA Tissue Perfusion: Goal: Complications of ischemic stroke/TIA will be minimized Outcome: Progressing   Problem: Coping: Goal: Will verbalize positive feelings about self Outcome: Progressing Goal: Will identify appropriate support needs Outcome: Progressing   Problem: Health Behavior/Discharge Planning: Goal: Ability to manage health-related needs will improve Outcome: Progressing Goal: Goals will be collaboratively established with patient/family Outcome: Progressing   Problem: Self-Care: Goal: Ability to participate in self-care as condition permits will improve Outcome: Progressing Goal: Verbalization of feelings and concerns over difficulty with self-care will  improve Outcome: Progressing Goal: Ability to communicate needs accurately will improve Outcome: Progressing   Problem: Nutrition: Goal: Risk of aspiration will decrease Outcome: Progressing Goal: Dietary intake will improve Outcome: Progressing

## 2023-06-23 NOTE — Progress Notes (Signed)
Pt went for procedure via wheel chair in stable condition.

## 2023-06-23 NOTE — Discharge Summary (Signed)
Physician Discharge Summary   Patient: Mia Todd MRN: [id number] DOB: 1945-10-04  Admit date:     06/22/2023  Discharge date: 06/23/23  Discharge Physician: Tyrone Nine   PCP: Ardyth Man, PA-C   Recommendations at discharge:  Follow up with PCP to discuss lipid lowering therapy, HTN, T2DM.  Follow up with neurology 1-2 months. Prescribed aspirin, plavix (DAPT x3 months, then aspirin alone), zetia.   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 female with a history of IDT2DM, HTN, upper GI bleed from gastropathy in 2022 (negative biopsy), known occipital lobe cyst, recent elevated immunoglobulin free light chain, currently undergoing workup by oncology, who presented to the ED 11/8 with transient right facial droop and numbness. Work up included CT head which confirmed the known cyst, but no acute intracranial abnormality. Subsequent MRI showed subacute stroke. Neurology was consulted, recommending work up which included MRA head/neck which showed intracranial stenosis without LVO, and echocardiogram which was normal. Her symptoms still resolved, medications for secondary prevention were initiated and she was discharged in stable condition 11/9.  Assessment and Plan: Subacute stroke: DAPT x3 months (not 3 weks due to severity of intracranial stenoses), lipid lowering therapy, HTN and T2DM management per PCP. F/u with neurology.   Cyst of occipital lobe, stable (HCC) CT showing: "Benign appearing cystic lesion measuring 3.7 cm at the left occipital lobe, relatively stable from prior MR" No acute disease suspected  Abnormality of plasma protein Currently being evaluated by oncology for elevated  immunoglobulin free light chain, last seen 10/30 No acute issues  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) No acute concerns.  Hemoglobin robust at 15.1  Insulin dependent type 2 diabetes mellitus (HCC) Continue home Tx  Hypertension Continue home Tx  Consultants: Neurology Procedures performed: Echo  Disposition: Home Diet recommendation:  Cardiac and Carb modified diet DISCHARGE MEDICATION: Allergies as of 06/23/2023       Reactions   Doxycycline Shortness Of Breath   Difficulty taking deep breaths   Levaquin [levofloxacin] Shortness Of Breath   Bactrim [sulfamethoxazole-trimethoprim]    She does not remember what her reaction is but she knows she is allergic to it.   Ciprofloxacin Other (See Comments)   Penicillins Swelling   Joint swelling   Prednisone    Other reaction(s): Headache, Other (See Comments) Uncontrollable blood sugar - states she did not have this reaction from cortisone injections   Statins Other (See Comments)   Sulfa Antibiotics Other (See Comments)        Medication List     STOP taking these medications    glimepiride 4 MG tablet Commonly known as: AMARYL       TAKE these medications    amLODipine 10 MG tablet Commonly known as: NORVASC Take 10 mg by mouth daily.   Ashwagandha 35 120 MG Caps Generic drug: Ashwagandha Take by mouth.   aspirin EC 81 MG tablet Take 1 tablet (81 mg total) by mouth daily. Swallow whole. Start taking on: June 24, 2023   benazepril 40 MG tablet Commonly known as: LOTENSIN Take 40 mg by mouth daily.   clopidogrel 75 MG tablet Commonly known as: PLAVIX Take 1 tablet (75 mg total) by mouth daily.  Start taking on: June 24, 2023   empagliflozin 10 MG Tabs tablet Commonly known as: JARDIANCE Take 1 tablet by mouth daily.   ezetimibe 10 MG tablet Commonly known as: Zetia Take 1 tablet (10 mg total) by mouth daily.   gabapentin 300 MG capsule Commonly known as: NEURONTIN Take  300 mg by mouth daily as needed (pain).   HumaLOG KwikPen 100 UNIT/ML KwikPen Generic drug: insulin lispro Inject 5 Units into the skin daily as needed (high blood sugar). Sliding scale   insulin detemir 100 UNIT/ML FlexPen Commonly known as: LEVEMIR Inject 14 Units into the skin daily.   metFORMIN 1000 MG tablet Commonly known as: GLUCOPHAGE Take 1,000 mg by mouth daily.   multivitamin capsule Take 1 capsule by mouth daily.   Premarin vaginal cream Generic drug: conjugated estrogens Estrogen Cream Instruction Discard applicator Apply pea sized amount to tip of finger to urethra before bed. Wash hands well after application. Use Monday, Wednesday and Friday   Soothe 0.6-0.6 % Soln Generic drug: Propylene Glycol-Glycerin Place 1 drop into both eyes daily as needed (dry eyes).   Super Twin EPA/DHA 1250 MG Caps Take 2 capsules by mouth daily.        Follow-up Information     Ardyth Man, PA-C Follow up.   Specialty: Family Medicine Contact information: [address] [place] [address] [phone]                Discharge Exam: Ceasar Mons Weights   06/22/23 1631  Weight: 57.2 kg  BP (!) 160/80 (BP Location: Right Arm)   Pulse 74   Temp 98.2 F (36.8 C)   Resp 18   Ht 5\' 4"  (1.626 m)   Wt 57.2 kg   SpO2 100%   BMI 21.63 kg/m   Plesaant WDWN female in no distress Clear, nonlabored RRR, no MRG or edema Soft, NT, ND, +BS Alert, oriented, steady gait, no focal deficits, normal speech  Condition at discharge: stable  The results of significant diagnostics from this hospitalization (including imaging, microbiology, ancillary and laboratory) are listed below for reference.   Imaging Studies: ECHOCARDIOGRAM COMPLETE BUBBLE STUDY  Result Date: 06/23/2023    ECHOCARDIOGRAM REPORT   Patient Name:   Mia Todd Date of Exam: 06/23/2023 Medical Rec #:  [id number]     Height:       64.0 in Accession #:    [id number]    Weight:       126.0 lb Date of Birth:   [birth year]     BSA:          1.608 m Patient Age:    77 years      BP:           157/60 mmHg Patient Gender: F             HR:           71 bpm. Exam Location:  [facility] Procedure: 2D Echo and Saline Contrast Bubble Study Indications:     Stroke I63.9  History:         Patient has no prior history of Echocardiogram examinations.  Sonographer:     Overton Mam RDCS, FASE Referring Phys:  [id number] Andris Baumann Diagnosing Phys: Rozell Searing Custovic IMPRESSIONS  1. Left ventricular ejection fraction, by estimation, is 60 to 65%. The left ventricle has normal function. The left ventricle has no regional wall motion abnormalities. Left ventricular diastolic parameters were normal.  2. Right ventricular systolic function is normal. The  right ventricular size is normal.  3. The mitral valve is normal in structure. Trivial mitral valve regurgitation. No evidence of mitral stenosis.  4. The aortic valve is normal in structure. Aortic valve regurgitation is not visualized. No aortic stenosis is present. FINDINGS  Left Ventricle: Left ventricular ejection fraction, by estimation, is 60 to 65%. The left ventricle has normal function. The left ventricle has no regional wall motion abnormalities. The left ventricular internal cavity size was normal in size. There is  no left ventricular hypertrophy. Left ventricular diastolic parameters were normal. Right Ventricle: The right ventricular size is normal. No increase in right ventricular wall thickness. Right ventricular systolic function is normal. Left Atrium: Left atrial size was normal in size. Right Atrium: Right atrial size was normal in size. Pericardium: There is no evidence of pericardial effusion. Mitral Valve: The mitral valve is normal in structure. Trivial mitral valve regurgitation. No evidence of mitral valve stenosis. Tricuspid Valve: The tricuspid valve is normal in structure. Tricuspid valve regurgitation is not demonstrated. Aortic Valve: The aortic valve is normal in  structure. Aortic valve regurgitation is not visualized. No aortic stenosis is present. Aortic valve peak gradient measures 8.5 mmHg. Pulmonic Valve: The pulmonic valve was normal in structure. Pulmonic valve regurgitation is not visualized. Aorta: The aortic root is normal in size and structure. IAS/Shunts: No atrial level shunt detected by color flow Doppler. Agitated saline contrast was given intravenously to evaluate for intracardiac shunting.  LEFT VENTRICLE PLAX 2D LVIDd:         4.40 cm   Diastology LVIDs:         2.90 cm   LV e' medial:    8.49 cm/s LV PW:         1.00 cm   LV E/e' medial:  8.4 LV IVS:        0.90 cm   LV e' lateral:   10.30 cm/s LVOT diam:     1.80 cm   LV E/e' lateral: 6.9 LV SV:         55 LV SV Index:   35 LVOT Area:     2.54 cm  RIGHT VENTRICLE RV Basal diam:  1.80 cm RV S prime:     12.00 cm/s TAPSE (M-mode): 1.9 cm LEFT ATRIUM             Index        RIGHT ATRIUM          Index LA diam:        3.10 cm 1.93 cm/m   RA Area:     7.25 cm LA Vol (A2C):   48.8 ml 30.36 ml/m  RA Volume:   12.20 ml 7.59 ml/m LA Vol (A4C):   29.2 ml 18.16 ml/m LA Biplane Vol: 38.7 ml 24.07 ml/m  AORTIC VALVE                 PULMONIC VALVE AV Area (Vmax): 1.64 cm     PV Vmax:        0.90 m/s AV Vmax:        146.00 cm/s  PV Peak grad:   3.2 mmHg AV Peak Grad:   8.5 mmHg     RVOT Peak grad: 2 mmHg LVOT Vmax:      94.20 cm/s LVOT Vmean:     59.100 cm/s LVOT VTI:       0.218 m  AORTA Ao Root diam: 3.00 cm Ao Asc diam:  2.70 cm MITRAL VALVE MV  Area (PHT): 2.10 cm     SHUNTS MV Decel Time: 362 msec     Systemic VTI:  0.22 m MV E velocity: 71.30 cm/s   Systemic Diam: 1.80 cm MV A velocity: 110.00 cm/s MV E/A ratio:  0.65 Sabina Custovic Electronically signed by Clotilde Dieter Signature Date/Time: 06/23/2023/6:18:13 PM    Final    US Carotid Bilateral  Result Date: 06/23/2023 CLINICAL DATA:  Transient ischemic attack EXAM: BILATERAL CAROTID DUPLEX ULTRASOUND TECHNIQUE: Wallace Cullens scale imaging, color Doppler  and duplex ultrasound were performed of bilateral carotid and vertebral arteries in the neck. COMPARISON:  None Available. FINDINGS: Criteria: Quantification of carotid stenosis is based on velocity parameters that correlate the residual internal carotid diameter with NASCET-based stenosis levels, using the diameter of the distal internal carotid lumen as the denominator for stenosis measurement. The following velocity measurements were obtained: RIGHT ICA: 143/33 cm/sec CCA: 52/15 cm/sec SYSTOLIC ICA/CCA RATIO:  2.8 ECA:  75 cm/sec LEFT ICA: 146/29 cm/sec CCA: 76/14 cm/sec SYSTOLIC ICA/CCA RATIO:  1.9 ECA:  238 cm/sec RIGHT CAROTID ARTERY: Heterogeneous atherosclerotic plaque throughout the common carotid artery extending into the internal carotid artery. By peak systolic velocity criteria, the estimated stenosis falls in the 50-69% diameter range. RIGHT VERTEBRAL ARTERY:  Patent with normal antegrade flow. LEFT CAROTID ARTERY: Heterogeneous atherosclerotic plaque in the common carotid artery and carotid bifurcation extending into both the internal and external carotid arteries. Significant elevation of the peak systolic velocity in the proximal external carotid artery consistent with a greater than 60% diameter stenosis. Elevated peak systolic velocities in the proximal internal carotid artery suggest a stenosis in the 50-69% diameter range. LEFT VERTEBRAL ARTERY:  Patent with normal antegrade flow. IMPRESSION: 1. 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 2. Moderate (50-69%) stenosis proximal left internal carotid artery secondary to heterogenous atherosclerotic plaque. 3. Incidental note is made of a greater than 60% diameter stenosis in the left external carotid artery. 4. Vertebral arteries are patent with normal antegrade flows. Signed, Sterling Big, MD, RPVI [facility]  Electronically Signed   By: Malachy Moan M.D.   On: 06/23/2023 07:12   MR BRAIN WO CONTRAST  Result Date: 06/23/2023 CLINICAL DATA:  Initial evaluation for neuro deficit, stroke suspected. EXAM: MRI HEAD WITHOUT CONTRAST TECHNIQUE: Multiplanar, multiecho pulse sequences of the brain and surrounding structures were obtained without intravenous contrast. COMPARISON:  Prior CT from 06/22/2023 and MRI from 01/25/2023. FINDINGS: Brain: Cerebral volume within normal limits. Patchy T2/FLAIR hyperintensity involving the periventricular and deep white matter both cerebral hemispheres, most consistent with chronic small vessel ischemic disease, mild in nature. Subtle subcentimeter focus of diffusion signal abnormality seen involving the subcortical posterior left frontal lobe (series 9, image 35). Associated FLAIR signal intensity without ADC correlate. Finding most consistent with an evolving small subacute infarct. No associated hemorrhage or mass effect. No other evidence for acute or subacute ischemia. Gray-white matter differentiation otherwise maintained. No acute or chronic intracranial blood products. Benign appearing cystic lesion involving the left occipital lobe again seen, measuring 3.5 x 2.4 x 3.2 cm. Overall, size and appearance is not significantly changed as compared to prior brain MRI. No other mass lesion or mass effect. No midline shift. No hydrocephalus or extra-axial fluid collection. Pituitary gland suprasellar region within normal limits. Vascular: Major intracranial vascular flow voids are maintained. Skull and upper cervical spine: Craniocervical junction within normal limits. Moderate spondylosis with reversal of the normal cervical lordosis  noted within the visualized upper cervical spine. Bone marrow signal intensity within normal limits. No scalp soft tissue abnormality. Sinuses/Orbits: Globes orbital soft tissues within normal limits. Mild mucosal thickening noted about the ethmoidal air  cells. Paranasal sinuses are otherwise clear. No mastoid effusion. Other: None. IMPRESSION: 1. Subcentimeter focus of diffusion signal abnormality involving the subcortical posterior left frontal lobe, most consistent with a small evolving subacute infarct. No associated hemorrhage or mass effect. 2. Underlying mild chronic microvascular ischemic disease. 3. 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 Electronically Signed   By: Rise Mu M.D.   On: 06/23/2023 04:05   CT HEAD WO CONTRAST  Result Date: 06/22/2023 CLINICAL DATA:  Initial evaluation for acute TIA. EXAM: CT HEAD WITHOUT CONTRAST TECHNIQUE: Contiguous axial images were obtained from the base of the skull through the vertex without intravenous contrast. RADIATION DOSE REDUCTION: This exam was performed according to the departmental dose-optimization program which includes automated exposure control, adjustment of the mA and/or kV according to patient size and/or use of iterative reconstruction technique. COMPARISON:  Comparison made with prior MRI from 01/25/2023. FINDINGS: Brain: Benign appearing cystic mass measuring 3.7 cm present at the left occipital lobe, relatively stable from prior MRI allowing for differences in technique. No acute intracranial hemorrhage. No acute large vessel territory infarct. No other mass lesion, mass effect or midline shift. No hydrocephalus or extra-axial fluid collection. Vascular: No convincing abnormal hyperdense vessel. Skull: Scalp soft tissues within normal limits.  Calvarium intact. Sinuses/Orbits: Globes orbital soft tissues within normal limits. Chronic posterior right ethmoidal sinus disease noted. Paranasal sinuses are otherwise clear. No mastoid effusion. Other: None. IMPRESSION: 1. No acute intracranial abnormality. 2. Benign appearing cystic lesion measuring 3.7 cm at the left occipital lobe, relatively stable from prior MRI.  Electronically Signed   By: Rise Mu M.D.   On: 06/22/2023 21:43    Microbiology: Results for orders placed or performed in visit on 04/11/22  Microscopic Examination     Status: Abnormal   Collection Time: 04/11/22  2:56 PM   Urine  Result Value Ref Range Status   WBC, UA 11-30 (A) 0 - 5 /hpf Final   RBC, Urine 0-2 0 - 2 /hpf Final   Epithelial Cells (non renal) 0-10 0 - 10 /hpf Final   Bacteria, UA Many (A) None seen/Few Final    Labs: CBC: Recent Labs  Lab 06/22/23 1633  WBC 6.3  NEUTROABS 3.7  HGB 15.1*  HCT 45.5  MCV 84.3  PLT 363   Basic Metabolic Panel: Recent Labs  Lab 06/22/23 1633  NA 136  K 3.6  CL 103  CO2 19*  GLUCOSE 99  BUN 17  CREATININE 0.67  CALCIUM 9.6   Liver Function Tests: Recent Labs  Lab 06/22/23 1633  AST 20  ALT 19  ALKPHOS 62  BILITOT 0.6  PROT 8.9*  ALBUMIN 4.7   CBG: Recent Labs  Lab 06/22/23 1632 06/23/23 0824 06/23/23 1219 06/23/23 1615  GLUCAP 98 163* 215* 216*    Discharge time spent: greater than 30 minutes.  Signed: Tyrone Nine, MD [facility] 06/23/2023

## 2023-06-23 NOTE — Consult Note (Signed)
NEUROLOGY CONSULT NOTE   Date of service: June 23, 2023 Patient Name: Mia Todd MRN:  [id number] DOB:  04-Jul-1946 Chief Complaint: "Transient right-sided facial droop and numbness" Requesting Provider: Tyrone Nine, MD  History of Present Illness  Mia Todd is a 77 y.o. female  has a past medical history of Chronic kidney disease, DDD (degenerative disc disease), lumbar, Diabetes mellitus without complication (HCC), Diverticulitis, GERD (gastroesophageal reflux disease), Hyperlipidemia, Hypertension, and Recurrent UTI. who presents with transient right facial droop and numbness. she was in normal state of health yesterday afternoon and then had a transient episode lasting approximately 2 minutes where her right face was droopy and numb.  This resolved but she sought care in the emergency department where an MRI was performed and demonstrated a subacute stroke.   LKW: Unclear(suspect that the symptoms of brought her in are not responsible for the stroke seen on MRI) Modified rankin score: 0-Completely asymptomatic and back to baseline post- stroke IV Thrombolysis:no, Unclear time of onset   Past History   Past Medical History:  Diagnosis Date   Chronic kidney disease    DDD (degenerative disc disease), lumbar    Diabetes mellitus without complication (HCC)    Diverticulitis    GERD (gastroesophageal reflux disease)    Hyperlipidemia    Hypertension    Recurrent UTI     Past Surgical History:  Procedure Laterality Date   BACK SURGERY     BIOPSY  01/07/2021   Procedure: BIOPSY;  Surgeon: Sherrilyn Rist, MD;  Location: [facility] ENDOSCOPY;  Service: Gastroenterology;;   BREAST CYST ASPIRATION Left 1974   neg   COLONOSCOPY WITH PROPOFOL N/A 03/05/2018   Procedure: COLONOSCOPY WITH PROPOFOL;  Surgeon: Christena Deem, MD;  Location: [facility] ENDOSCOPY;  Service: Endoscopy;  Laterality: N/A;   COLONOSCOPY WITH PROPOFOL N/A 01/08/2021   Procedure: COLONOSCOPY WITH PROPOFOL;   Surgeon: Sherrilyn Rist, MD;  Location: [facility] ENDOSCOPY;  Service: Gastroenterology;  Laterality: N/A;   DILATION AND CURETTAGE OF UTERUS     ESOPHAGOGASTRODUODENOSCOPY (EGD) WITH PROPOFOL N/A 01/07/2021   Procedure: ESOPHAGOGASTRODUODENOSCOPY (EGD) WITH PROPOFOL;  Surgeon: Sherrilyn Rist, MD;  Location: [facility] ENDOSCOPY;  Service: Gastroenterology;  Laterality: N/A;   LUMBAR FUSION     REMOVE OVARIAN CYST     SPINAL FUSION     TUBAL LIGATION      Family History: Family History  Problem Relation Age of Onset   Breast cancer Neg Hx     Social History  reports that she quit smoking about 31 years ago. Her smoking use included cigarettes. She started smoking about 46 years ago. She has a 15 pack-year smoking history. She has never used smokeless tobacco. She reports current alcohol use of about 1.0 standard drink of alcohol per week. She reports that she does not currently use drugs.  Allergies  Allergen Reactions   Doxycycline Shortness Of Breath    Difficulty taking deep breaths   Levaquin [Levofloxacin] Shortness Of Breath   Bactrim [Sulfamethoxazole-Trimethoprim]     She does not remember what her reaction is but she knows she is allergic to it.   Ciprofloxacin Other (See Comments)   Penicillins Swelling    Joint swelling   Prednisone     Other reaction(s): Headache, Other (See Comments) Uncontrollable blood sugar - states she did not have this reaction from cortisone injections   Statins Other (See Comments)   Sulfa Antibiotics Other (See Comments)    Medications   Current Facility-Administered  Medications:    acetaminophen (TYLENOL) tablet 650 mg, 650 mg, Oral, Q4H PRN **OR** acetaminophen (TYLENOL) 160 MG/5ML solution 650 mg, 650 mg, Per Tube, Q4H PRN **OR** acetaminophen (TYLENOL) suppository 650 mg, 650 mg, Rectal, Q4H PRN, Mia Baumann, MD   aspirin EC tablet 81 mg, 81 mg, Oral, Daily, Mia Junker B, MD, 81 mg at 06/23/23 1042   clopidogrel (PLAVIX) tablet 75 mg, 75  mg, Oral, Daily, Mia Junker B, MD, 75 mg at 06/23/23 1042   enoxaparin (LOVENOX) injection 40 mg, 40 mg, Subcutaneous, Q24H, Mia Royal V, MD   ezetimibe (ZETIA) tablet 10 mg, 10 mg, Oral, Daily, Mia Junker B, MD, 10 mg at 06/23/23 1043   insulin aspart (novoLOG) injection 0-5 Units, 0-5 Units, Subcutaneous, QHS, Mia Todd, Mia V, MD   insulin aspart (novoLOG) injection 0-9 Units, 0-9 Units, Subcutaneous, TID WC, Mia Baumann, MD, 3 Units at 06/23/23 1236   insulin detemir (LEVEMIR) injection 10 Units, 10 Units, Subcutaneous, Daily, Mia Royal V, MD, 10 Units at 06/23/23 0910  Vitals   Vitals:   06/23/23 0050 06/23/23 0401 06/23/23 0813 06/23/23 1208  BP: (!) 183/69 (!) 157/60 (!) 155/77 (!) 186/67  Pulse: 64 70 71 64  Resp: 15 18 14 16   Temp: 97.6 F (36.4 C) 97.7 F (36.5 C) 98.6 F (37 C) 98.3 F (36.8 C)  TempSrc: Oral     SpO2: 97%  98% 100%  Weight:      Height:        Body mass index is 21.63 kg/m.  Physical Exam   Constitutional: Appears well-developed and well-nourished.   Neurologic Examination     Neuro: Mental Status: Patient is awake, alert, oriented to person, place, month, year, and situation. Patient is able to give a clear and coherent history. No signs of aphasia or neglect Cranial Nerves: II: Visual Fields are full. Pupils are equal, round, and reactive to light.   III,IV, VI: EOMI without ptosis or diploplia.  Todd: Facial sensation is symmetric to temperature VII: Facial movement is symmetric.  VIII: hearing is intact to voice X: Uvula elevates symmetrically XI: Shoulder shrug is symmetric. XII: tongue is midline without atrophy or fasciculations.  Motor: Tone is normal. Bulk is normal. 5/5 strength was present in all four extremities.  Sensory: Sensation is symmetric to light touch and temperature in the arms and legs. Cerebellar: FNF are intact bilaterally     Labs/Imaging/Neurodiagnostic studies   CBC:  Recent Labs  Lab  06/22/23 1633  WBC 6.3  NEUTROABS 3.7  HGB 15.1*  HCT 45.5  MCV 84.3  PLT 363    Basic Metabolic Panel:  Lab Results  Component Value Date   NA 136 06/22/2023   K 3.6 06/22/2023   CO2 19 (L) 06/22/2023   GLUCOSE 99 06/22/2023   BUN 17 06/22/2023   CREATININE 0.67 06/22/2023   CALCIUM 9.6 06/22/2023   GFRNONAA >60 06/22/2023   GFRAA >60 10/19/2013    Lipid Panel:  Lab Results  Component Value Date   LDLCALC 167 (H) 06/23/2023    HgbA1c:  Lab Results  Component Value Date   HGBA1C 8.2 (H) 06/23/2023    Urine Drug Screen: No results found for: "LABOPIA", "COCAINSCRNUR", "LABBENZ", "AMPHETMU", "THCU", "LABBARB"   Alcohol Level     Component Value Date/Time   ETH <10 06/22/2023 1633    INR  Lab Results  Component Value Date   INR 0.9 06/22/2023    APTT  Lab Results  Component Value  Date   APTT 24 06/22/2023    AED levels: No results found for: "PHENYTOIN", "ZONISAMIDE", "LAMOTRIGINE", "LEVETIRACETA"   MR Agio head without contrast and Carotid Duplex BL(Personally reviewed): She has significant intracranial stenosis 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  This appears new since her study in June.  Impression   Valenda Mcleary is a 77 y.o. female  has a past medical history of Chronic kidney disease, DDD (degenerative disc disease), lumbar, Diabetes mellitus without complication (HCC), Diverticulitis, GERD (gastroesophageal reflux disease), Hyperlipidemia, Hypertension, and Recurrent UTI.  She presents with transient right facial weakness and numbness which is most consistent with transient ischemic attack.  I suspect that the stroke that is seen on MRI is not the one responsible for her presentation.  Either way, however, she will need secondary risk factor modification.  She does have stenosis on her MR angiogram and I would favor 3  months of dual antiplatelet therapy.  She has had significant problems with statin therapy and therefore I would recommend her discussing a PSK9 inhibitor with her PCP.  She is hesitant because she does not want infusions, but I encouraged her to discuss it further.   Recommendations  Aspirin and Plavix x 90 days Lipid management with a goal LDL less than 70, could consider Zetia which is already been started while she is waiting to talk to her PCP about PSK nine inhibitors Diabetes management with goal A1c less than seven Hypertension control with gradual reduction to normotension. Neurology will be available on an as-needed basis ______________________________________________________________________    Signed,  Mia Todd [facility]

## 2023-06-25 ENCOUNTER — Encounter: Payer: Self-pay | Admitting: Oncology

## 2023-06-25 ENCOUNTER — Inpatient Hospital Stay: Payer: Medicare Other | Attending: Oncology | Admitting: Oncology

## 2023-06-25 DIAGNOSIS — R768 Other specified abnormal immunological findings in serum: Secondary | ICD-10-CM

## 2023-06-25 NOTE — Progress Notes (Unsigned)
Pt reports she had a stroke Friday & still trying to recover from it.

## 2023-06-26 NOTE — Progress Notes (Signed)
I connected with Mia Todd on 06/25/2023 by video enabled telemedicine visit and verified that I am speaking with the correct person using two identifiers.   I discussed the limitations, risks, security and privacy concerns of performing an evaluation and management service by telemedicine and the availability of in-person appointments. I also discussed with the patient that there may be a patient responsible charge related to this service. The patient expressed understanding and agreed to proceed.  Other persons participating in the visit and their role in the encounter:  none  Patient's location:  home Provider's location:  work  Stage manager Complaint: Discuss results of blood work  History of present illness: Patient is a 77 year old female with a past medical history significant for hypertension hyperlipidemia GERD.  She has been referred for elevated free light chains.  Blood work from October 2024 showed a normal BMP with a creatinine of 0.7.  Serum protein electrophoresis showed no M protein by electrophoresis serum free kappa light chain mildly elevated at 3.8 with a free light chain ratio of 0.03.   Results of blood work from 06/13/2023 were as follows: CBC showed a normal H&H of 13.3/40.9.  Serum creatinine and calcium normal.  Total protein mildly elevated at 8.2.  Myeloma panel did not show any evidence of M protein but showed polyclonal increase in 1 or more immunoglobulins.  Kappa free light chain mildly elevated at 35 with a ratio of 1.88.  Urine random protein electrophoresis did not show any evidence of M protein.  Interval history: Visit was mostly carried out via video interface but the last part of the visit was switched to audio due to inaccessible video.  Patient recently recovered from TIA after she was admitted to the hospital 3 days ago.  She will be following up with neurology as an outpatient   Review of Systems  Constitutional:  Negative for chills, fever, malaise/fatigue  and weight loss.  HENT:  Negative for congestion, ear discharge and nosebleeds.   Eyes:  Negative for blurred vision.  Respiratory:  Negative for cough, hemoptysis, sputum production, shortness of breath and wheezing.   Cardiovascular:  Negative for chest pain, palpitations, orthopnea and claudication.  Gastrointestinal:  Negative for abdominal pain, blood in stool, constipation, diarrhea, heartburn, melena, nausea and vomiting.  Genitourinary:  Negative for dysuria, flank pain, frequency, hematuria and urgency.  Musculoskeletal:  Negative for back pain, joint pain and myalgias.  Skin:  Negative for rash.  Neurological:  Negative for dizziness, tingling, focal weakness, seizures, weakness and headaches.  Endo/Heme/Allergies:  Does not bruise/bleed easily.  Psychiatric/Behavioral:  Negative for depression and suicidal ideas. The patient does not have insomnia.     Allergies  Allergen Reactions   Doxycycline Shortness Of Breath    Difficulty taking deep breaths   Levaquin [Levofloxacin] Shortness Of Breath   Bactrim [Sulfamethoxazole-Trimethoprim]     She does not remember what her reaction is but she knows she is allergic to it.   Ciprofloxacin Other (See Comments)   Penicillins Swelling    Joint swelling   Prednisone     Other reaction(s): Headache, Other (See Comments) Uncontrollable blood sugar - states she did not have this reaction from cortisone injections   Statins Other (See Comments)   Sulfa Antibiotics Other (See Comments)    Past Medical History:  Diagnosis Date   Chronic kidney disease    DDD (degenerative disc disease), lumbar    Diabetes mellitus without complication (HCC)    Diverticulitis    GERD (gastroesophageal  reflux disease)    Hyperlipidemia    Hypertension    Recurrent UTI     Past Surgical History:  Procedure Laterality Date   BACK SURGERY     BIOPSY  01/07/2021   Procedure: BIOPSY;  Surgeon: Sherrilyn Rist, MD;  Location: [facility] ENDOSCOPY;   Service: Gastroenterology;;   BREAST CYST ASPIRATION Left 1974   neg   COLONOSCOPY WITH PROPOFOL N/A 03/05/2018   Procedure: COLONOSCOPY WITH PROPOFOL;  Surgeon: Christena Deem, MD;  Location: [facility] ENDOSCOPY;  Service: Endoscopy;  Laterality: N/A;   COLONOSCOPY WITH PROPOFOL N/A 01/08/2021   Procedure: COLONOSCOPY WITH PROPOFOL;  Surgeon: Sherrilyn Rist, MD;  Location: [facility] ENDOSCOPY;  Service: Gastroenterology;  Laterality: N/A;   DILATION AND CURETTAGE OF UTERUS     ESOPHAGOGASTRODUODENOSCOPY (EGD) WITH PROPOFOL N/A 01/07/2021   Procedure: ESOPHAGOGASTRODUODENOSCOPY (EGD) WITH PROPOFOL;  Surgeon: Sherrilyn Rist, MD;  Location: [facility] ENDOSCOPY;  Service: Gastroenterology;  Laterality: N/A;   LUMBAR FUSION     REMOVE OVARIAN CYST     SPINAL FUSION     TUBAL LIGATION      Social History   Socioeconomic History   Marital status: Single    Spouse name: Not on file   Number of children: Not on file   Years of education: Not on file   Highest education level: Not on file  Occupational History   Not on file  Tobacco Use   Smoking status: Former    Current packs/day: 0.00    Average packs/day: 1 pack/day for 15.0 years (15.0 ttl pk-yrs)    Types: Cigarettes    Start date: 08/14/1976    Quit date: 08/15/1991    Years since quitting: 31.8   Smokeless tobacco: Never  Vaping Use   Vaping status: Never Used  Substance and Sexual Activity   Alcohol use: Yes    Alcohol/week: 1.0 standard drink of alcohol    Types: 1 Glasses of wine per week    Comment: 3-4 GLASSES WINE/WEEK   Drug use: Not Currently   Sexual activity: Not Currently  Other Topics Concern   Not on file  Social History Narrative   Not on file   Social Determinants of Health   Financial Resource Strain: Medium Risk (05/29/2023)   [facility] from [facility]   Overall Financial Resource Strain (CARDIA)    Difficulty of Paying Living Expenses: Somewhat hard  Food Insecurity: No Food Insecurity  (06/23/2023)   Hunger Vital Sign    Worried About Running Out of Food in the Last Year: Never true    Ran Out of Food in the Last Year: Never true  Recent Concern: Food Insecurity - Food Insecurity Present (05/29/2023)   Received from [facility]   Hunger Vital Sign    Worried About Running Out of Food in the Last Year: Sometimes true    Ran Out of Food in the Last Year: Sometimes true  Transportation Needs: No Transportation Needs (06/22/2023)   PRAPARE - Administrator, Civil Service (Medical): No    Lack of Transportation (Non-Medical): No  Physical Activity: Not on file  Stress: Not on file  Social Connections: Not on file  Intimate Partner Violence: Not At Risk (06/22/2023)   Humiliation, Afraid, Rape, and Kick questionnaire    Fear of Current or Ex-Partner: No    Emotionally Abused: No    Physically Abused: No    Sexually Abused: No    Family History  Problem Relation Age of Onset   Breast cancer Neg Hx      Current Outpatient Medications:    amLODipine (NORVASC) 10 MG tablet, Take 10 mg by mouth daily. , Disp: , Rfl:    Ashwagandha (ASHWAGANDHA 35) 120 MG CAPS, Take by mouth., Disp: , Rfl:    aspirin EC 81 MG tablet, Take 1 tablet (81 mg total) by mouth daily. Swallow whole., Disp: 90 tablet, Rfl: 3   benazepril (LOTENSIN) 40 MG tablet, Take 40 mg by mouth daily. , Disp: , Rfl:    clopidogrel (PLAVIX) 75 MG tablet, Take 1 tablet (75 mg total) by mouth daily., Disp: 90 tablet, Rfl: 0   empagliflozin (JARDIANCE) 10 MG TABS tablet, Take 1 tablet by mouth daily., Disp: , Rfl:    ezetimibe (ZETIA) 10 MG tablet, Take 1 tablet (10 mg total) by mouth daily., Disp: 30 tablet, Rfl: 0   gabapentin (NEURONTIN) 300 MG capsule, Take 300 mg by mouth daily as needed (pain)., Disp: , Rfl:    HUMALOG KWIKPEN 100 UNIT/ML KwikPen, Inject 5 Units into the skin daily as needed (high blood sugar). Sliding scale, Disp: , Rfl:    insulin detemir (LEVEMIR) 100 UNIT/ML  FlexPen, Inject 14 Units into the skin daily., Disp: , Rfl:    metFORMIN (GLUCOPHAGE) 1000 MG tablet, Take 1,000 mg by mouth daily. , Disp: , Rfl:    Multiple Vitamin (MULTIVITAMIN) capsule, Take 1 capsule by mouth daily., Disp: , Rfl:    Omega-3 Fatty Acids (SUPER TWIN EPA/DHA) 1250 MG CAPS, Take 2 capsules by mouth daily., Disp: , Rfl:    PREMARIN vaginal cream, Estrogen Cream Instruction Discard applicator Apply pea sized amount to tip of finger to urethra before bed. Wash hands well after application. Use Monday, Wednesday and Friday, Disp: 42.5 g, Rfl: 11   Propylene Glycol-Glycerin (SOOTHE) 0.6-0.6 % SOLN, Place 1 drop into both eyes daily as needed (dry eyes). (Patient not taking: Reported on 06/13/2023), Disp: , Rfl:   MR ANGIO HEAD WO CONTRAST  Result Date: 06/23/2023 CLINICAL DATA:  Follow-up examination for stroke. EXAM: MRA HEAD WITHOUT CONTRAST TECHNIQUE: Angiographic images of the Circle of Willis were acquired using MRA technique without intravenous contrast. COMPARISON:  Prior brain MRI from 06/22/2023. FINDINGS: Anterior circulation: Right ICA patent through the siphon without significant stenosis. 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 Right A1 segment dominant and widely patent. Left A1 hypoplastic and/or absent. Normal anterior communicating artery complex. Right ACA widely patent without stenosis. Atheromatous irregularity about the left ACA with associated mild to moderate left A2 stenoses (series 1026, image 12). ACAs remain patent to their distal aspects. No M1 stenosis or occlusion. No proximal MCA branch occlusion or high-grade stenosis. Distal MCA branches perfused and symmetric. Posterior circulation: Both V4 segments patent without stenosis. Left PICA patent. Right PICA not well seen. Basilar patent without stenosis. Superior cerebral arteries patent bilaterally. Both PCA supplied via the basilar. Right PCA  patent to its distal aspect without stenosis. Mild to moderate diffuse narrowing present about the left P2 segment (series 1044, image 7). Left PCA otherwise patent to its distal aspect. Anatomic variants: As above. Other: No intracranial aneurysm. IMPRESSION: 1. Negative intracranial MRA for large vessel occlusion. 2. 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 3. Mild to moderate left A2 and left P2 stenoses as above. Electronically Signed   By: Rise Mu M.D.   On: 06/23/2023 21:20  ECHOCARDIOGRAM COMPLETE BUBBLE STUDY  Result Date: 06/23/2023    ECHOCARDIOGRAM REPORT   Patient Name:   GWENDOLEN TIJERINA Date of Exam: 06/23/2023 Medical Rec #:  [id number]     Height:       64.0 in Accession #:    [id number]    Weight:       126.0 lb Date of Birth:  17-Oct-1945     BSA:          1.608 m Patient Age:    77 years      BP:           157/60 mmHg Patient Gender: F             HR:           71 bpm. Exam Location:  [facility] Procedure: 2D Echo and Saline Contrast Bubble Study Indications:     Stroke I63.9  History:         Patient has no prior history of Echocardiogram examinations.  Sonographer:     Overton Mam RDCS, FASE Referring Phys:  [id number] Andris Baumann Diagnosing Phys: Rozell Searing Custovic IMPRESSIONS  1. Left ventricular ejection fraction, by estimation, is 60 to 65%. The left ventricle has normal function. The left ventricle has no regional wall motion abnormalities. Left ventricular diastolic parameters were normal.  2. Right ventricular systolic function is normal. The right ventricular size is normal.  3. The mitral valve is normal in structure. Trivial mitral valve regurgitation. No evidence of mitral stenosis.  4. The aortic valve is normal in structure. Aortic valve regurgitation is not visualized. No aortic stenosis is present. FINDINGS  Left Ventricle: Left ventricular ejection fraction, by estimation, is 60 to 65%. The left ventricle has normal function. The left  ventricle has no regional wall motion abnormalities. The left ventricular internal cavity size was normal in size. There is  no left ventricular hypertrophy. Left ventricular diastolic parameters were normal. Right Ventricle: The right ventricular size is normal. No increase in right ventricular wall thickness. Right ventricular systolic function is normal. Left Atrium: Left atrial size was normal in size. Right Atrium: Right atrial size was normal in size. Pericardium: There is no evidence of pericardial effusion. Mitral Valve: The mitral valve is normal in structure. Trivial mitral valve regurgitation. No evidence of mitral valve stenosis. Tricuspid Valve: The tricuspid valve is normal in structure. Tricuspid valve regurgitation is not demonstrated. Aortic Valve: The aortic valve is normal in structure. Aortic valve regurgitation is not visualized. No aortic stenosis is present. Aortic valve peak gradient measures 8.5 mmHg. Pulmonic Valve: The pulmonic valve was normal in structure. Pulmonic valve regurgitation is not visualized. Aorta: The aortic root is normal in size and structure. IAS/Shunts: No atrial level shunt detected by color flow Doppler. Agitated saline contrast was given intravenously to evaluate for intracardiac shunting.  LEFT VENTRICLE PLAX 2D LVIDd:         4.40 cm   Diastology LVIDs:         2.90 cm   LV e' medial:    8.49 cm/s LV PW:         1.00 cm   LV E/e' medial:  8.4 LV IVS:        0.90 cm   LV e' lateral:   10.30 cm/s LVOT diam:     1.80 cm   LV E/e' lateral: 6.9 LV SV:         55 LV SV Index:   35 LVOT Area:  2.54 cm  RIGHT VENTRICLE RV Basal diam:  1.80 cm RV S prime:     12.00 cm/s TAPSE (M-mode): 1.9 cm LEFT ATRIUM             Index        RIGHT ATRIUM          Index LA diam:        3.10 cm 1.93 cm/m   RA Area:     7.25 cm LA Vol (A2C):   48.8 ml 30.36 ml/m  RA Volume:   12.20 ml 7.59 ml/m LA Vol (A4C):   29.2 ml 18.16 ml/m LA Biplane Vol: 38.7 ml 24.07 ml/m  AORTIC VALVE                  PULMONIC VALVE AV Area (Vmax): 1.64 cm     PV Vmax:        0.90 m/s AV Vmax:        146.00 cm/s  PV Peak grad:   3.2 mmHg AV Peak Grad:   8.5 mmHg     RVOT Peak grad: 2 mmHg LVOT Vmax:      94.20 cm/s LVOT Vmean:     59.100 cm/s LVOT VTI:       0.218 m  AORTA Ao Root diam: 3.00 cm Ao Asc diam:  2.70 cm MITRAL VALVE MV Area (PHT): 2.10 cm     SHUNTS MV Decel Time: 362 msec     Systemic VTI:  0.22 m MV E velocity: 71.30 cm/s   Systemic Diam: 1.80 cm MV A velocity: 110.00 cm/s MV E/A ratio:  0.65 Designer, multimedia signed by Clotilde Dieter Signature Date/Time: 06/23/2023/6:18:13 PM    Final    US Carotid Bilateral  Result Date: 06/23/2023 CLINICAL DATA:  Transient ischemic attack EXAM: BILATERAL CAROTID DUPLEX ULTRASOUND TECHNIQUE: Wallace Cullens scale imaging, color Doppler and duplex ultrasound were performed of bilateral carotid and vertebral arteries in the neck. COMPARISON:  None Available. FINDINGS: Criteria: Quantification of carotid stenosis is based on velocity parameters that correlate the residual internal carotid diameter with NASCET-based stenosis levels, using the diameter of the distal internal carotid lumen as the denominator for stenosis measurement. The following velocity measurements were obtained: RIGHT ICA: 143/33 cm/sec CCA: 52/15 cm/sec SYSTOLIC ICA/CCA RATIO:  2.8 ECA:  75 cm/sec LEFT ICA: 146/29 cm/sec CCA: 76/14 cm/sec SYSTOLIC ICA/CCA RATIO:  1.9 ECA:  238 cm/sec RIGHT CAROTID ARTERY: Heterogeneous atherosclerotic plaque throughout the common carotid artery extending into the internal carotid artery. By peak systolic velocity criteria, the estimated stenosis falls in the 50-69% diameter range. RIGHT VERTEBRAL ARTERY:  Patent with normal antegrade flow. LEFT CAROTID ARTERY: Heterogeneous atherosclerotic plaque in the common carotid artery and carotid bifurcation extending into both the internal and external carotid arteries. Significant elevation of the peak systolic  velocity in the proximal external carotid artery consistent with a greater than 60% diameter stenosis. Elevated peak systolic velocities in the proximal internal carotid artery suggest a stenosis in the 50-69% diameter range. LEFT VERTEBRAL ARTERY:  Patent with normal antegrade flow. IMPRESSION: 1. 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 2. Moderate (50-69%) stenosis proximal left internal carotid artery secondary to heterogenous atherosclerotic plaque. 3. Incidental note is made of a greater than 60% diameter stenosis in the left external carotid artery. 4. Vertebral arteries are patent with normal antegrade flows. Signed, Sterling Big, MD, RPVI [facility] Electronically Signed   By:  Malachy Moan M.D.   On: 06/23/2023 07:12   MR BRAIN WO CONTRAST  Result Date: 06/23/2023 CLINICAL DATA:  Initial evaluation for neuro deficit, stroke suspected. EXAM: MRI HEAD WITHOUT CONTRAST TECHNIQUE: Multiplanar, multiecho pulse sequences of the brain and surrounding structures were obtained without intravenous contrast. COMPARISON:  Prior CT from 06/22/2023 and MRI from 01/25/2023. FINDINGS: Brain: Cerebral volume within normal limits. Patchy T2/FLAIR hyperintensity involving the periventricular and deep white matter both cerebral hemispheres, most consistent with chronic small vessel ischemic disease, mild in nature. Subtle subcentimeter focus of diffusion signal abnormality seen involving the subcortical posterior left frontal lobe (series 9, image 35). Associated FLAIR signal intensity without ADC correlate. Finding most consistent with an evolving small subacute infarct. No associated hemorrhage or mass effect. No other evidence for acute or subacute ischemia. Gray-white matter differentiation otherwise maintained. No acute or chronic intracranial blood products. Benign  appearing cystic lesion involving the left occipital lobe again seen, measuring 3.5 x 2.4 x 3.2 cm. Overall, size and appearance is not significantly changed as compared to prior brain MRI. No other mass lesion or mass effect. No midline shift. No hydrocephalus or extra-axial fluid collection. Pituitary gland suprasellar region within normal limits. Vascular: Major intracranial vascular flow voids are maintained. Skull and upper cervical spine: Craniocervical junction within normal limits. Moderate spondylosis with reversal of the normal cervical lordosis noted within the visualized upper cervical spine. Bone marrow signal intensity within normal limits. No scalp soft tissue abnormality. Sinuses/Orbits: Globes orbital soft tissues within normal limits. Mild mucosal thickening noted about the ethmoidal air cells. Paranasal sinuses are otherwise clear. No mastoid effusion. Other: None. IMPRESSION: 1. Subcentimeter focus of diffusion signal abnormality involving the subcortical posterior left frontal lobe, most consistent with a small evolving subacute infarct. No associated hemorrhage or mass effect. 2. Underlying mild chronic microvascular ischemic disease. 3. 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 Electronically Signed   By: Rise Mu M.D.   On: 06/23/2023 04:05   CT HEAD WO CONTRAST  Result Date: 06/22/2023 CLINICAL DATA:  Initial evaluation for acute TIA. EXAM: CT HEAD WITHOUT CONTRAST TECHNIQUE: Contiguous axial images were obtained from the base of the skull through the vertex without intravenous contrast. RADIATION DOSE REDUCTION: This exam was performed according to the departmental dose-optimization program which includes automated exposure control, adjustment of the mA and/or kV according to patient size and/or use of iterative reconstruction technique. COMPARISON:  Comparison made with prior MRI from 01/25/2023. FINDINGS:  Brain: Benign appearing cystic mass measuring 3.7 cm present at the left occipital lobe, relatively stable from prior MRI allowing for differences in technique. No acute intracranial hemorrhage. No acute large vessel territory infarct. No other mass lesion, mass effect or midline shift. No hydrocephalus or extra-axial fluid collection. Vascular: No convincing abnormal hyperdense vessel. Skull: Scalp soft tissues within normal limits.  Calvarium intact. Sinuses/Orbits: Globes orbital soft tissues within normal limits. Chronic posterior right ethmoidal sinus disease noted. Paranasal sinuses are otherwise clear. No mastoid effusion. Other: None. IMPRESSION: 1. No acute intracranial abnormality. 2. Benign appearing cystic lesion measuring 3.7 cm at the left occipital lobe, relatively stable from prior MRI. Electronically Signed   By: Rise Mu M.D.   On: 06/22/2023 21:43    No images are attached to the encounter.      Latest Ref Rng & Units 06/22/2023    4:33 PM  CMP  Glucose 70 - 99 mg/dL 99  BUN 8 - 23 mg/dL 17   Creatinine 9.14 - 1.00 mg/dL 7.82   Sodium 956 - 213 mmol/L 136   Potassium 3.5 - 5.1 mmol/L 3.6   Chloride 98 - 111 mmol/L 103   CO2 22 - 32 mmol/L 19   Calcium 8.9 - 10.3 mg/dL 9.6   Total Protein 6.5 - 8.1 g/dL 8.9   Total Bilirubin <0.8 mg/dL 0.6   Alkaline Phos 38 - 126 U/L 62   AST 15 - 41 U/L 20   ALT 0 - 44 U/L 19       Latest Ref Rng & Units 06/22/2023    4:33 PM  CBC  WBC 4.0 - 10.5 K/uL 6.3   Hemoglobin 12.0 - 15.0 g/dL 65.7   Hematocrit 84.6 - 46.0 % 45.5   Platelets 150 - 400 K/uL 363      Observation/objective: Appears in no acute distress over video visit today.  Breathing is nonlabored  Assessment and plan: Patient is 77 year old female referred for abnormal free light chain  Kappa free light chain is mildly elevated at 35 with a ratio 1.7.  No evidence of M protein on SPEP or urine protein electrophoresis.  No evidence of crab criteria.   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  No indication for bone marrow biopsy.  I will repeat CBC with differential CMP myeloma panel and serum free light chains in 6 months and see her thereafter  Follow-up instructions: As above  I discussed the assessment and treatment plan with the patient. The patient was provided an opportunity to ask questions and all were answered. The patient agreed with the plan and demonstrated an understanding of the instructions.   The patient was advised to call back or seek an in-person evaluation if the symptoms worsen or if the condition fails to improve as anticipated.  I provided 13 minutes of face-to-face video visit time along with audo visit as well during this encounter, and > 50% was spent counseling as documented under my assessment & plan.  Visit Diagnosis: 1. Elevated serum immunoglobulin free light chain level     Dr. Owens Shark, MD, MPH [facility] Tel- [phone] 06/26/2023 8:43 AM
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
## 2023-07-30 ENCOUNTER — Ambulatory Visit: Payer: Medicare Other | Admitting: Psychiatry

## 2023-07-30 ENCOUNTER — Encounter: Payer: Self-pay | Admitting: Psychiatry

## 2023-07-30 VITALS — BP 152/76 | HR 88 | Temp 98.2°F | Ht 64.0 in | Wt 126.8 lb

## 2023-07-30 DIAGNOSIS — F3341 Major depressive disorder, recurrent, in partial remission: Secondary | ICD-10-CM

## 2023-07-30 NOTE — Patient Instructions (Signed)
Referral to therapy onsite Next appointment- as needed

## 2023-09-04 ENCOUNTER — Ambulatory Visit (INDEPENDENT_AMBULATORY_CARE_PROVIDER_SITE_OTHER): Payer: Medicare HMO | Admitting: Professional Counselor

## 2023-09-04 DIAGNOSIS — F330 Major depressive disorder, recurrent, mild: Secondary | ICD-10-CM

## 2023-09-04 DIAGNOSIS — F3341 Major depressive disorder, recurrent, in partial remission: Secondary | ICD-10-CM
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
## 2023-09-11 ENCOUNTER — Ambulatory Visit: Payer: Medicare HMO | Admitting: Professional Counselor

## 2023-09-11 DIAGNOSIS — F3341 Major depressive disorder, recurrent, in partial remission: Secondary | ICD-10-CM | POA: Diagnosis not present

## 2023-09-11 NOTE — Progress Notes (Signed)
   THERAPIST PROGRESS NOTE  Session Time: 11:00 AM - 11:55 AM   Participation Level: Active  Behavioral Response: MeticulousAlertDysphoric  Type of Therapy: Individual Therapy  Treatment Goals addressed: Active OP Depression  LTG: "Finding out what my flaws are and working on them."     Start:  09/11/23    Expected End:  09/09/24     STG: "Having some value to myself." To improve concepts of self AEB identifying self-worth and increasing purpose in day-to-day life.    STG: Demonstrates progress in stages of grief at own pace   ProgressTowards Goals: Initial  Interventions: CBT  Summary: Mia Todd is a 78 y.o. female who presents with MDD. Mia Todd appeared alert and oriented x5. She is meticulously dressed and well-groomed. Her mood is dysphoric but cooperative. She reported she needed to be honest after statements made in initial CCA. Mia Todd divulged being estranged from her son as well. She discussed possible reasons but noted she is unsure about why he has chosen to not engage with her. Mia Todd expressed thoughts of being a bad mother. She was able to reflect on past memories of when she was showing up for her children, protecting them, and taking care of them. Mia Todd was receptive to focus on these memories when struggling with negative thoughts about her parenting. Mia Todd engaged in developing her treatment plan. She would like to continue working on her negative thinking and processing grief.  Therapist Response: Conducted session with Mia Todd. Began session with check-in/update since previous session. Utilized empathetic and reflective listening. Encouraged Liridona to share memories of parenting and highlighted how she was a caring mother in spite of potential downfalls. Developed treatment plan with input from [place] on current strengths, needs, and progress towards goals. Scheduled next appointment and concluded session.   Suicidal/Homicidal: Passive SI, thoughts of wanting to be  dead, denies plan or intent to harm self  Plan: Return again in 2 weeks.  Diagnosis: MDD (major depressive disorder), recurrent, in partial remission (HCC)  Collaboration of Care: N/A  Patient/Guardian was advised Release of Information must be obtained prior to any record release in order to collaborate their care with an outside provider. Patient/Guardian was advised if they have not already done so to contact the registration department to sign all necessary forms in order for [place] to release information regarding their care.   Consent: Patient/Guardian gives verbal consent for treatment and assignment of benefits for services provided during this visit. Patient/Guardian expressed understanding and agreed to proceed.   Edmonia Lynch, [facility] 09/11/2023

## 2023-09-12 ENCOUNTER — Ambulatory Visit: Payer: Medicare HMO | Admitting: Professional Counselor

## 2023-09-25 ENCOUNTER — Ambulatory Visit: Payer: Medicare HMO | Admitting: Professional Counselor

## 2023-09-25 DIAGNOSIS — F3341 Major depressive disorder, recurrent, in partial remission: Secondary | ICD-10-CM | POA: Diagnosis not present

## 2023-09-25 NOTE — Progress Notes (Unsigned)
   THERAPIST PROGRESS NOTE  Session Time: 1:00 PM - 1:54 PM  Participation Level: {BHH PARTICIPATION LEVEL:22264}  Behavioral Response: {Appearance:22683}{BHH LEVEL OF CONSCIOUSNESS:22305}{BHH MOOD:22306}  Type of Therapy: {CHL AMB BH Type of Therapy:21022741}  Treatment Goals addressed: ***  ProgressTowards Goals: {Progress Towards Goals:21014066}  Interventions: {CHL AMB BH Type of Intervention:21022753}  Summary: Mia Todd is a 78 y.o. female who presents with ***.   Suicidal/Homicidal: {BHH YES OR NO:22294}{yes/no/with/without intent/plan:22693}  Therapist Response: grief/friend, behavior action/chunking, future plans  Plan: Return again in *** weeks.  Diagnosis: No diagnosis found.  Collaboration of Care: {BH OP Collaboration of Care:21014065}  Patient/Guardian was advised Release of Information must be obtained prior to any record release in order to collaborate their care with an outside provider. Patient/Guardian was advised if they have not already done so to contact the registration department to sign all necessary forms in order for [place] to release information regarding their care.   Consent: Patient/Guardian gives verbal consent for treatment and assignment of benefits for services provided during this visit. Patient/Guardian expressed understanding and agreed to proceed.   Edmonia Lynch, [facility] 09/25/2023

## 2023-10-15 ENCOUNTER — Other Ambulatory Visit: Payer: Self-pay | Admitting: Family Medicine

## 2023-10-15 DIAGNOSIS — R221 Localized swelling, mass and lump, neck: Secondary | ICD-10-CM

## 2023-10-19 ENCOUNTER — Ambulatory Visit: Admission: RE | Admit: 2023-10-19 | Source: Ambulatory Visit

## 2023-10-22 ENCOUNTER — Ambulatory Visit (INDEPENDENT_AMBULATORY_CARE_PROVIDER_SITE_OTHER): Payer: Medicare HMO | Admitting: Professional Counselor

## 2023-10-22 DIAGNOSIS — Z91199 Patient's noncompliance with other medical treatment and regimen due to unspecified reason: Secondary | ICD-10-CM

## 2023-10-22 NOTE — Progress Notes (Signed)
 Patient no-showed today's appointment; appointment was for 10/22/23 at 1 PM for follow-up outpatient therapy.

## 2023-11-05 ENCOUNTER — Ambulatory Visit (INDEPENDENT_AMBULATORY_CARE_PROVIDER_SITE_OTHER): Payer: Medicare HMO | Admitting: Professional Counselor

## 2023-11-05 DIAGNOSIS — F3341 Major depressive disorder, recurrent, in partial remission: Secondary | ICD-10-CM | POA: Diagnosis not present
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
## 2023-12-21 ENCOUNTER — Other Ambulatory Visit: Payer: Self-pay

## 2023-12-21 DIAGNOSIS — R768 Other specified abnormal immunological findings in serum: Secondary | ICD-10-CM

## 2023-12-24 ENCOUNTER — Inpatient Hospital Stay: Payer: Medicare Other | Attending: Oncology

## 2023-12-24 ENCOUNTER — Inpatient Hospital Stay: Payer: Medicare Other | Admitting: Oncology

## 2023-12-26 ENCOUNTER — Ambulatory Visit (INDEPENDENT_AMBULATORY_CARE_PROVIDER_SITE_OTHER): Admitting: Professional Counselor

## 2023-12-26 DIAGNOSIS — F3341 Major depressive disorder, recurrent, in partial remission: Secondary | ICD-10-CM
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
## 2024-02-11 ENCOUNTER — Ambulatory Visit: Admitting: Professional Counselor
